# Patient Record
Sex: Female | Born: 1975 | Race: White | Hispanic: No | Marital: Married | Smoking: Current some day smoker
Health system: Southern US, Community
[De-identification: ages and names within clinical notes are randomized; demographics above are authoritative.]

## PROBLEM LIST (undated history)

## (undated) ENCOUNTER — Ambulatory Visit: Payer: Medicaid Other

## (undated) DIAGNOSIS — F419 Anxiety disorder, unspecified: Secondary | ICD-10-CM

## (undated) DIAGNOSIS — I10 Essential (primary) hypertension: Secondary | ICD-10-CM

## (undated) DIAGNOSIS — G43909 Migraine, unspecified, not intractable, without status migrainosus: Secondary | ICD-10-CM

## (undated) DIAGNOSIS — N912 Amenorrhea, unspecified: Secondary | ICD-10-CM

## (undated) HISTORY — DX: Amenorrhea, unspecified: N91.2

## (undated) HISTORY — PX: MENISCUS REPAIR: SHX5179

## (undated) HISTORY — DX: Migraine, unspecified, not intractable, without status migrainosus: G43.909

## (undated) HISTORY — DX: Anxiety disorder, unspecified: F41.9

---

## 2013-02-17 ENCOUNTER — Emergency Department: Payer: Self-pay | Admitting: Emergency Medicine

## 2013-02-17 LAB — URINALYSIS, COMPLETE
Bilirubin,UR: NEGATIVE
Glucose,UR: NEGATIVE mg/dL (ref 0–75)
Ketone: NEGATIVE
Nitrite: NEGATIVE
Ph: 5 (ref 4.5–8.0)
RBC,UR: 6 /HPF (ref 0–5)
Specific Gravity: 1.018 (ref 1.003–1.030)
Squamous Epithelial: 4

## 2013-02-19 LAB — URINE CULTURE

## 2014-12-06 ENCOUNTER — Emergency Department: Admit: 2014-12-06 | Disposition: A | Discharge status: Home / Self Care | Payer: Self-pay | Admitting: Internal Medicine

## 2015-01-02 ENCOUNTER — Emergency Department: Payer: Self-pay

## 2015-01-02 ENCOUNTER — Emergency Department
Admission: EM | Admit: 2015-01-02 | Discharge: 2015-01-02 | Disposition: A | Discharge status: Home / Self Care | Payer: Self-pay | Attending: Emergency Medicine | Admitting: Emergency Medicine

## 2015-01-02 DIAGNOSIS — J40 Bronchitis, not specified as acute or chronic: Secondary | ICD-10-CM

## 2015-01-02 DIAGNOSIS — Z720 Tobacco use: Secondary | ICD-10-CM | POA: Insufficient documentation

## 2015-01-02 DIAGNOSIS — I10 Essential (primary) hypertension: Secondary | ICD-10-CM | POA: Insufficient documentation

## 2015-01-02 DIAGNOSIS — J45901 Unspecified asthma with (acute) exacerbation: Secondary | ICD-10-CM | POA: Insufficient documentation

## 2015-01-02 HISTORY — DX: Essential (primary) hypertension: I10

## 2015-01-02 MED ORDER — PREDNISONE 20 MG PO TABS
60.0000 mg | ORAL_TABLET | Freq: Once | ORAL | Status: AC
  Administered 2015-01-02: 60 mg via ORAL

## 2015-01-02 MED ORDER — PREDNISONE 20 MG PO TABS
ORAL_TABLET | ORAL | Status: AC
  Filled 2015-01-02: qty 3

## 2015-01-02 MED ORDER — AZITHROMYCIN 250 MG PO TABS: ORAL_TABLET | ORAL | Status: DC

## 2015-01-02 MED ORDER — ALBUTEROL SULFATE HFA 108 (90 BASE) MCG/ACT IN AERS: 2.0000 | INHALATION_SPRAY | RESPIRATORY_TRACT | Status: DC | PRN

## 2015-01-02 MED ORDER — IPRATROPIUM-ALBUTEROL 0.5-2.5 (3) MG/3ML IN SOLN
3.0000 mL | Freq: Once | RESPIRATORY_TRACT | Status: AC
  Administered 2015-01-02: 3 mL via RESPIRATORY_TRACT

## 2015-01-02 MED ORDER — IPRATROPIUM-ALBUTEROL 0.5-2.5 (3) MG/3ML IN SOLN
RESPIRATORY_TRACT | Status: AC
  Filled 2015-01-02: qty 3

## 2015-01-02 MED ORDER — PREDNISONE 10 MG PO TABS: ORAL_TABLET | ORAL | Status: DC

## 2015-01-02 NOTE — ED Notes (Signed)
NAD noted at time of D/C. Pt denies questions or concerns. Pt ambulatory to the lobby at this time.  

## 2015-01-02 NOTE — ED Notes (Signed)
Pt c/o cough with congestion for the past 3-4 weeks.  Pt is in NAD at presernt.

## 2015-01-02 NOTE — ED Provider Notes (Signed)
[facility] Emergency Department Provider Note  ____________________________________________  Time seen: Approximately 2:20 PM  I have reviewed the triage vital signs and the nursing notes.   HISTORY  Chief Complaint Cough   HPI Joyce Gonzalez is a 39 y.o. female presents for the complaints of 3-4 weeks with cough and congestion. Patient states cough is worse at night with intermittent wheezes. Patient denies chest pain or shortness of breath. Denies fever. Reports continues to eat and drink well.  Reports history of asthma and states that she frequently has wheezing with sickness. Also reports seasonal allergies.   Past Medical History  Diagnosis Date  . Asthma   . Hypertension     There are no active problems to display for this patient.   Past Surgical History  Procedure Laterality Date  . Meniscus repair Right     Current Outpatient Rx  Name  Route  Sig  Dispense  Refill  . albuterol (PROVENTIL HFA;VENTOLIN HFA) 108 (90 BASE) MCG/ACT inhaler   Inhalation   Inhale 2 puffs into the lungs every 4 (four) hours as needed for wheezing or shortness of breath.         . Multiple Vitamins-Minerals (MULTIVITAMIN WITH MINERALS) tablet   Oral   Take 1 tablet by mouth daily.           Allergies Review of patient's allergies indicates no known allergies.  No family history on file.  Social History History  Substance Use Topics  . Smoking status: Current Every Day Smoker -- 0.50 packs/day    Types: Cigarettes  . Smokeless tobacco: Never Used  . Alcohol Use: No    Review of Systems Constitutional: No fever/chills Eyes: No visual changes. ENT: No sore throat. Positive for runny nose and congestion Cardiovascular: Denies chest pain. Respiratory: Denies shortness of breath. Positive for cough and wheezes Gastrointestinal: No abdominal pain.  No nausea, no vomiting.  No diarrhea.  No constipation. Genitourinary: Negative for  dysuria. Musculoskeletal: Negative for back pain. Skin: Negative for rash. Neurological: Negative for headaches, focal weakness or numbness.  10-point ROS otherwise negative.  ____________________________________________   PHYSICAL EXAM:  VITAL SIGNS: ED Triage Vitals  Enc Vitals Group     BP 01/02/15 1308 122/85 mmHg     Pulse Rate 01/02/15 1308 94     Resp 01/02/15 1308 20     Temp 01/02/15 1308 98.6 F (37 C)     Temp Source 01/02/15 1308 Oral     SpO2 01/02/15 1308 98 %     Weight 01/02/15 1308 172 lb (78.019 kg)     Height 01/02/15 1308  (1.676 m)     Head Cir --      Peak Flow --      Pain Score 01/02/15 1309 6     Pain Loc --      Pain Edu? --      Excl. in GC? --     Constitutional: Alert and oriented. Well appearing and in no acute distress. Eyes: Conjunctivae are normal. PERRL. EOMI. Head: Atraumatic. Nose:mild clear rhinorrhea Mouth/Throat: Mucous membranes are moist.  Oropharynx non-erythematous. Neck: No stridor.  No cervical spine tenderness to palpation. Hematological/Lymphatic/Immunilogical: No cervical lymphadenopathy. Cardiovascular: Normal rate, regular rhythm. Grossly normal heart sounds.  Good peripheral circulation. Respiratory: Normal respiratory effort.  No retractions. Mild inspiratory wheezes left upper and lower lobe. No rhonchi or rales. Gastrointestinal: Soft and nontender. No distention. No abdominal bruits. No CVA tenderness. Musculoskeletal: No lower extremity tenderness  nor edema.  No joint effusions. Neurologic:  Normal speech and language. No gross focal neurologic deficits are appreciated. Speech is normal. No gait instability. Skin:  Skin is warm, dry and intact. No rash noted. Psychiatric: Mood and affect are normal. Speech and behavior are normal.   RADIOLOGY  CHEST 2 VIEW  COMPARISON: None.  FINDINGS: Lungs are clear. Heart size and pulmonary vascularity are normal. No adenopathy. No bone  lesions.  IMPRESSION: No edema or consolidation.   Electronically Signed By: Bretta BangWilliam Woodruff III M.D. On: 01/02/2015 15:07 _______________________________________________________________________________________   INITIAL IMPRESSION / ASSESSMENT AND PLAN / ED COURSE  Pertinent labs & imaging results that were available during my care of the patient were reviewed by me and considered in my medical decision making (see chart for details).  Well-appearing patient. Patient presents to the ER for 3-4 weeks of runny nose, cough, congestion with intermittent wheezes. Patient with wheezing on initial presentation to the ER.   1510: After albuterol nebulizer wheezes resolved and patient reports feeling much better. Chest x-ray negative for edema or consolidation. Patient to be discharged with prednisone taper, z-pack,  as well as albuterol as needed. Discussed follow-up and return parameters. Patient agreed to plan.  ____________________________________________   FINAL CLINICAL IMPRESSION(S) / ED DIAGNOSES  Final diagnoses:  Bronchitis      Renford DillsLindsey Marlei Glomski, NP 01/02/15 1541  Minna AntisKevin Paduchowski, MD 01/04/15 1249

## 2015-01-02 NOTE — Discharge Instructions (Signed)
Take medication as prescribed. Drink plenty of water. Rest. Follow-up with primary care physician or the above .  Return to the ER for new or worsening concerns.

## 2015-08-10 ENCOUNTER — Encounter: Payer: Self-pay | Admitting: Emergency Medicine

## 2015-08-10 ENCOUNTER — Ambulatory Visit
Admission: EM | Admit: 2015-08-10 | Discharge: 2015-08-10 | Disposition: A | Discharge status: Home / Self Care | Payer: Medicaid Other | Attending: Family Medicine | Admitting: Family Medicine

## 2015-08-10 DIAGNOSIS — J4521 Mild intermittent asthma with (acute) exacerbation: Secondary | ICD-10-CM | POA: Diagnosis not present

## 2015-08-10 DIAGNOSIS — R300 Dysuria: Secondary | ICD-10-CM | POA: Insufficient documentation

## 2015-08-10 DIAGNOSIS — R509 Fever, unspecified: Secondary | ICD-10-CM | POA: Insufficient documentation

## 2015-08-10 LAB — URINALYSIS COMPLETE WITH MICROSCOPIC (ARMC ONLY)
Bilirubin Urine: NEGATIVE
Glucose, UA: NEGATIVE mg/dL
Ketones, ur: NEGATIVE mg/dL
Leukocytes, UA: NEGATIVE
Nitrite: NEGATIVE
PROTEIN: NEGATIVE mg/dL
Specific Gravity, Urine: 1.01 (ref 1.005–1.030)
pH: 6 (ref 5.0–8.0)

## 2015-08-10 MED ORDER — AZITHROMYCIN 250 MG PO TABS: ORAL_TABLET | ORAL | Status: DC

## 2015-08-10 NOTE — ED Provider Notes (Signed)
CSN: [id number]     Arrival date & time 08/10/15  1347 History   First MD Initiated Contact with Patient 08/10/15 1512     Chief Complaint  Patient presents with  . Dysuria  . Cough   (Consider location/radiation/quality/duration/timing/severity/associated sxs/prior Treatment) Patient is a 40 y.o. female presenting with URI. The history is provided by the patient.  URI Presenting symptoms: congestion, cough, ear pain, fever and rhinorrhea   Severity:  Moderate Onset quality:  Sudden Duration:  1 week Timing:  Constant Progression:  Worsening Chronicity:  New Relieved by:  Nothing Ineffective treatments:  OTC medications Associated symptoms: myalgias and wheezing   Risk factors: chronic respiratory disease (history of mild intermittent asthma)     Past Medical History  Diagnosis Date  . Asthma   . Hypertension    Past Surgical History  Procedure Laterality Date  . Meniscus repair Right    History reviewed. No pertinent family history. Social History  Substance Use Topics  . Smoking status: Current Every Day Smoker -- 0.50 packs/day    Types: Cigarettes  . Smokeless tobacco: Never Used  . Alcohol Use: No   OB History    Gravida Para Term Preterm AB TAB SAB Ectopic Multiple Living   3         3     Review of Systems  Constitutional: Positive for fever.  HENT: Positive for congestion, ear pain and rhinorrhea.   Respiratory: Positive for cough and wheezing.   Musculoskeletal: Positive for myalgias.    Allergies  Review of patient's allergies indicates no known allergies.  Home Medications   Prior to Admission medications   Medication Sig Start Date End Date Taking? Authorizing Provider  albuterol (PROVENTIL HFA;VENTOLIN HFA) 108 (90 BASE) MCG/ACT inhaler Inhale 2 puffs into the lungs every 4 (four) hours as needed for wheezing or shortness of breath. 01/02/15   Renford DillsLindsey Miller, NP  azithromycin (ZITHROMAX Z-PAK) 250 MG tablet 2 tabs po once day 1, then 1 tab po qd  for next 4 days 08/10/15   Payton Mccallumrlando Braylon Lemmons, MD  Multiple Vitamins-Minerals (MULTIVITAMIN WITH MINERALS) tablet Take 1 tablet by mouth daily.    Historical Provider, MD  predniSONE (DELTASONE) 10 MG tablet Start 60 mg po day one, then 50 mg po day two, taper by 10 mg daily until complete. 01/02/15   Renford DillsLindsey Miller, NP   Meds Ordered and Administered this Visit  Medications - No data to display  BP 137/92 mmHg  Pulse 83  Temp(Src) 97.7 F (36.5 C) (Oral)  Resp 16  Ht 5\' 6"  (1.676 m)  Wt 175 lb (79.379 kg)  BMI 28.26 kg/m2  SpO2 99%  LMP 07/27/2015 (Approximate) No data found.   Physical Exam  Constitutional: She appears well-developed and well-nourished. No distress.  HENT:  Head: Normocephalic and atraumatic.  Right Ear: Tympanic membrane, external ear and ear canal normal.  Left Ear: Tympanic membrane, external ear and ear canal normal.  Nose: Rhinorrhea present. No nose lacerations, sinus tenderness, nasal deformity, septal deviation or nasal septal hematoma. No epistaxis.  No foreign bodies.  Mouth/Throat: Uvula is midline and mucous membranes are normal. Posterior oropharyngeal erythema present. No oropharyngeal exudate, posterior oropharyngeal edema or tonsillar abscesses.  Eyes: Conjunctivae and EOM are normal. Pupils are equal, round, and reactive to light. Right eye exhibits no discharge. Left eye exhibits no discharge. No scleral icterus.  Neck: Normal range of motion. Neck supple. No thyromegaly present.  Cardiovascular: Normal rate, regular rhythm and normal heart sounds.  Pulmonary/Chest: Effort normal. No respiratory distress. She has wheezes (diffuse, mild wheezes bilaterally). She has no rales.  Diffuse rhonchi   Lymphadenopathy:    She has no cervical adenopathy.  Skin: She is not diaphoretic.  Nursing note and vitals reviewed.   ED Course  Procedures (including critical care time)  Labs Review Labs Reviewed  URINALYSIS COMPLETEWITH MICROSCOPIC ([facility] ONLY) -  Abnormal; Notable for the following:    Hgb urine dipstick TRACE (*)    Bacteria, UA RARE (*)    Squamous Epithelial / LPF 6-30 (*)    All other components within normal limits  URINE CULTURE    Imaging Review No results found.   Visual Acuity Review  Right Eye Distance:   Left Eye Distance:   Bilateral Distance:    Right Eye Near:   Left Eye Near:    Bilateral Near:         MDM   1. Asthmatic bronchitis, mild intermittent, with acute exacerbation    Discharge Medication List as of 08/10/2015  3:21 PM     Discharge Medication List as of 08/10/2015  3:21 PM    1.diagnosis reviewed with patient 2. rx as per orders above; reviewed possible side effects, interactions, risks and benefits; given rx for zithromax as per orders 3. Recommend supportive treatment with rest, increased fluids; continue home albuterol inhaler prn  4. Follow-up prn if symptoms worsen or don't improve   Payton Mccallum, MD 08/10/15 1527

## 2015-08-10 NOTE — ED Notes (Signed)
Patient c/o cough and chest congestion for a week.   Patient c/o burning when urinating for a week.

## 2015-08-12 ENCOUNTER — Telehealth: Payer: Self-pay | Admitting: *Deleted

## 2015-08-12 LAB — URINE CULTURE
Culture: NO GROWTH
SPECIAL REQUESTS: NORMAL

## 2015-08-12 NOTE — ED Notes (Signed)
Unable to leave message. Home number not in service and work number does not have a Technical brewermailbox.

## 2015-08-25 ENCOUNTER — Ambulatory Visit
Admission: EM | Admit: 2015-08-25 | Discharge: 2015-08-25 | Disposition: A | Discharge status: Home / Self Care | Payer: Medicaid Other | Attending: Family Medicine | Admitting: Family Medicine

## 2015-08-25 DIAGNOSIS — S139XXA Sprain of joints and ligaments of unspecified parts of neck, initial encounter: Secondary | ICD-10-CM | POA: Diagnosis not present

## 2015-08-25 MED ORDER — CYCLOBENZAPRINE HCL 10 MG PO TABS: 10.0000 mg | ORAL_TABLET | Freq: Every day | ORAL | Status: DC

## 2015-08-25 MED ORDER — HYDROCODONE-ACETAMINOPHEN 5-325 MG PO TABS: 1.0000 | ORAL_TABLET | Freq: Four times a day (QID) | ORAL | Status: DC | PRN

## 2015-08-25 NOTE — ED Notes (Signed)
Pt states she turned her head quickly about 4 days ago and is having pain to the right side since

## 2015-08-25 NOTE — ED Provider Notes (Signed)
CSN: [id number]     Arrival date & time 08/25/15  2130 History   First MD Initiated Contact with Patient 08/25/15 1036     Chief Complaint  Patient presents with  . Neck Pain   (Consider location/radiation/quality/duration/timing/severity/associated sxs/prior Treatment) Patient is a 41 y.o. female presenting with neck pain. The history is provided by the patient.  Neck Pain Pain location:  R side Quality:  Aching Pain radiates to:  Does not radiate Pain severity:  Moderate Pain is:  Same all the time Onset quality:  Sudden Duration:  4 days Timing:  Constant Progression:  Unchanged Chronicity:  New Context: not recent injury   Context comment:  Twisting her head   Past Medical History  Diagnosis Date  . Asthma   . Hypertension    Past Surgical History  Procedure Laterality Date  . Meniscus repair Right    No family history on file. Social History  Substance Use Topics  . Smoking status: Current Every Day Smoker -- 0.50 packs/day    Types: Cigarettes  . Smokeless tobacco: Never Used  . Alcohol Use: No   OB History    Gravida Para Term Preterm AB TAB SAB Ectopic Multiple Living   3         3     Review of Systems  Musculoskeletal: Positive for neck pain.    Allergies  Review of patient's allergies indicates no known allergies.  Home Medications   Prior to Admission medications   Medication Sig Start Date End Date Taking? Authorizing Provider  albuterol (PROVENTIL HFA;VENTOLIN HFA) 108 (90 BASE) MCG/ACT inhaler Inhale 2 puffs into the lungs every 4 (four) hours as needed for wheezing or shortness of breath. 01/02/15   Renford Dills, NP  azithromycin (ZITHROMAX Z-PAK) 250 MG tablet 2 tabs po once day 1, then 1 tab po qd for next 4 days 08/10/15   Payton Mccallum, MD  cyclobenzaprine (FLEXERIL) 10 MG tablet Take 1 tablet (10 mg total) by mouth at bedtime. 08/25/15   Payton Mccallum, MD  HYDROcodone-acetaminophen (NORCO/VICODIN) 5-325 MG tablet Take 1-2 tablets by mouth  every 6 (six) hours as needed. 08/25/15   Payton Mccallum, MD  Multiple Vitamins-Minerals (MULTIVITAMIN WITH MINERALS) tablet Take 1 tablet by mouth daily.    Historical Provider, MD  predniSONE (DELTASONE) 10 MG tablet Start 60 mg po day one, then 50 mg po day two, taper by 10 mg daily until complete. 01/02/15   Renford Dills, NP   Meds Ordered and Administered this Visit  Medications - No data to display  BP 120/77 mmHg  Pulse 87  Temp(Src) 98.1 F (36.7 C) (Oral)  Resp 16  Ht  (1.575 m)  Wt 170 lb (77.111 kg)  BMI 31.09 kg/m2  SpO2 98%  LMP 07/27/2015 (Approximate) No data found.   Physical Exam  Constitutional: She is oriented to person, place, and time. She appears well-developed and well-nourished. No distress.  HENT:  Head: Normocephalic and atraumatic.  Eyes: EOM are normal. Pupils are equal, round, and reactive to light.  Neck: Normal range of motion. Neck supple. No tracheal deviation present. No thyromegaly present.  Musculoskeletal: She exhibits no edema.       Cervical back: She exhibits tenderness (over the right trapezius muscle) and spasm. She exhibits normal range of motion, no bony tenderness, no swelling, no edema, no deformity, no laceration and normal pulse.  Lymphadenopathy:    She has no cervical adenopathy.  Neurological: She is alert and oriented to person,  place, and time. She has normal reflexes. No cranial nerve deficit. She exhibits normal muscle tone. Coordination normal.  Skin: She is not diaphoretic.  Nursing note and vitals reviewed.   ED Course  Procedures (including critical care time)  Labs Review Labs Reviewed - No data to display  Imaging Review No results found.   Visual Acuity Review  Right Eye Distance:   Left Eye Distance:   Bilateral Distance:    Right Eye Near:   Left Eye Near:    Bilateral Near:         MDM   1. Neck sprain, initial encounter    Discharge Medication List as of 08/25/2015 12:24 PM    START  taking these medications   Details  cyclobenzaprine (FLEXERIL) 10 MG tablet Take 1 tablet (10 mg total) by mouth at bedtime., Starting 08/25/2015, Until Discontinued, Normal    HYDROcodone-acetaminophen (NORCO/VICODIN) 5-325 MG tablet Take 1-2 tablets by mouth every 6 (six) hours as needed., Starting 08/25/2015, Until Discontinued, Print       1. diagnosis reviewed with patient  2. rx as per orders above; reviewed possible side effects, interactions, risks and benefits  3. Recommend supportive treatment with heat to affected area, gentle stretches and range of motion exercises 4. Follow-up prn if symptoms worsen or don't improve    Payton Mccallum, MD 08/25/15 1815

## 2015-10-22 ENCOUNTER — Encounter: Payer: Self-pay | Admitting: *Deleted

## 2015-10-22 ENCOUNTER — Ambulatory Visit
Admission: EM | Admit: 2015-10-22 | Discharge: 2015-10-22 | Disposition: A | Discharge status: Home / Self Care | Payer: Medicaid Other | Attending: Family Medicine | Admitting: Family Medicine

## 2015-10-22 ENCOUNTER — Ambulatory Visit: Payer: Medicaid Other

## 2015-10-22 DIAGNOSIS — R091 Pleurisy: Secondary | ICD-10-CM | POA: Diagnosis present

## 2015-10-22 DIAGNOSIS — J40 Bronchitis, not specified as acute or chronic: Secondary | ICD-10-CM | POA: Diagnosis not present

## 2015-10-22 DIAGNOSIS — R05 Cough: Secondary | ICD-10-CM | POA: Diagnosis present

## 2015-10-22 DIAGNOSIS — J209 Acute bronchitis, unspecified: Secondary | ICD-10-CM | POA: Insufficient documentation

## 2015-10-22 DIAGNOSIS — Z87891 Personal history of nicotine dependence: Secondary | ICD-10-CM | POA: Insufficient documentation

## 2015-10-22 DIAGNOSIS — J029 Acute pharyngitis, unspecified: Secondary | ICD-10-CM | POA: Diagnosis present

## 2015-10-22 DIAGNOSIS — I10 Essential (primary) hypertension: Secondary | ICD-10-CM | POA: Diagnosis not present

## 2015-10-22 LAB — RAPID INFLUENZA A&B ANTIGENS
Influenza A (ARMC): NEGATIVE
Influenza B (ARMC): NEGATIVE

## 2015-10-22 MED ORDER — HYDROCOD POLST-CPM POLST ER 10-8 MG/5ML PO SUER: 5.0000 mL | Freq: Two times a day (BID) | ORAL | Status: DC

## 2015-10-22 MED ORDER — ALBUTEROL SULFATE HFA 108 (90 BASE) MCG/ACT IN AERS: 1.0000 | INHALATION_SPRAY | Freq: Four times a day (QID) | RESPIRATORY_TRACT | Status: DC | PRN

## 2015-10-22 MED ORDER — AZITHROMYCIN 250 MG PO TABS: ORAL_TABLET | ORAL | Status: DC

## 2015-10-22 MED ORDER — BENZONATATE 200 MG PO CAPS: 200.0000 mg | ORAL_CAPSULE | Freq: Three times a day (TID) | ORAL | Status: DC

## 2015-10-22 NOTE — ED Provider Notes (Signed)
CSN: [id number]     Arrival date & time 10/22/15  1221 History   First MD Initiated Contact with Patient 10/22/15 1332     Chief Complaint  Patient presents with  . Pleurisy  . Fever  . Sore Throat  . Cough   (Consider location/radiation/quality/duration/timing/severity/associated sxs/prior Treatment) HPI   This a 40 year old female who presents with a productive cough of yellow to green sputum chest soreness with the coughing didn't fever up to 100 along with sinus symptoms. This been going on for 3 weeks. It seems to be worse today with shortness of breath and increased coughing particularly at nighttime. Her family has had recent upper URI symptoms. Patient has frequent bouts of asthmatic bronchitis and just quit smoking about 4 months ago. Her husband continues to smoke a pack a day. Today she is afebrile at 98 pulse is 120 respirations 16 blood pressure 120/86 and her O2 sats are 98% on room air.  Past Medical History  Diagnosis Date  . Asthma   . Hypertension    Past Surgical History  Procedure Laterality Date  . Meniscus repair Right    History reviewed. No pertinent family history. Social History  Substance Use Topics  . Smoking status: Former Smoker -- 0.50 packs/day    Types: Cigarettes  . Smokeless tobacco: Never Used  . Alcohol Use: Yes   OB History    Gravida Para Term Preterm AB TAB SAB Ectopic Multiple Living   3         3     Review of Systems  Constitutional: Positive for fever, chills, activity change and fatigue.  HENT: Positive for congestion, postnasal drip, rhinorrhea and sinus pressure.   Respiratory: Positive for cough and shortness of breath. Negative for wheezing and stridor.   All other systems reviewed and are negative.   Allergies  Review of patient's allergies indicates no known allergies.  Home Medications   Prior to Admission medications   Medication Sig Start Date End Date Taking? Authorizing Provider  fluticasone (FLONASE) 50  MCG/ACT nasal spray Place 2 sprays into both nostrils daily.   Yes Historical Provider, MD  Multiple Vitamins-Minerals (MULTIVITAMIN WITH MINERALS) tablet Take 1 tablet by mouth daily.   Yes Historical Provider, MD  albuterol (PROVENTIL HFA;VENTOLIN HFA) 108 (90 Base) MCG/ACT inhaler Inhale 1-2 puffs into the lungs every 6 (six) hours as needed for wheezing or shortness of breath. 10/22/15   Lutricia Feil, PA-C  azithromycin (ZITHROMAX Z-PAK) 250 MG tablet Use as per package instructions 10/22/15   Lutricia Feil, PA-C  benzonatate (TESSALON) 200 MG capsule Take 1 capsule (200 mg total) by mouth every 8 (eight) hours. 10/22/15   Lutricia Feil, PA-C  chlorpheniramine-HYDROcodone (TUSSIONEX PENNKINETIC ER) 10-8 MG/5ML SUER Take 5 mLs by mouth 2 (two) times daily. 10/22/15   Lutricia Feil, PA-C  cyclobenzaprine (FLEXERIL) 10 MG tablet Take 1 tablet (10 mg total) by mouth at bedtime. 08/25/15   Payton Mccallum, MD  HYDROcodone-acetaminophen (NORCO/VICODIN) 5-325 MG tablet Take 1-2 tablets by mouth every 6 (six) hours as needed. 08/25/15   Payton Mccallum, MD  predniSONE (DELTASONE) 10 MG tablet Start 60 mg po day one, then 50 mg po day two, taper by 10 mg daily until complete. 01/02/15   Renford Dills, NP   Meds Ordered and Administered this Visit  Medications - No data to display  BP 128/86 mmHg  Pulse 120  Temp(Src) 98 F (36.7 C) (Oral)  Resp 16  Ht  (  1.676 m)  Wt 185 lb (83.915 kg)  BMI 29.87 kg/m2  SpO2 98%  LMP 10/08/2015 (Exact Date) No data found.   Physical Exam  Constitutional: She is oriented to person, place, and time. She appears well-developed and well-nourished. No distress.  HENT:  Head: Normocephalic and atraumatic.  Right Ear: External ear normal.  Left Ear: External ear normal.  Nose: Nose normal.  Mouth/Throat: Oropharynx is clear and moist. No oropharyngeal exudate.  Eyes: Conjunctivae are normal. Pupils are equal, round, and reactive to light.  Neck: Normal  range of motion.  Pulmonary/Chest: Effort normal. No respiratory distress. She has no wheezes. She has rales.  Fine bibasilar rales are appreciated  Musculoskeletal: Normal range of motion. She exhibits no edema or tenderness.  Lymphadenopathy:    She has no cervical adenopathy.  Neurological: She is alert and oriented to person, place, and time.  Skin: Skin is warm and dry. She is not diaphoretic.  Psychiatric: She has a normal mood and affect. Her behavior is normal. Judgment and thought content normal.  Nursing note and vitals reviewed.   ED Course  Procedures (including critical care time)  Labs Review Labs Reviewed  RAPID INFLUENZA A&B ANTIGENS [facility] ONLY)    Imaging Review Dg Chest 2 View  10/22/2015  CLINICAL DATA:  Dry cough for 3 weeks with fever EXAM: CHEST  2 VIEW COMPARISON:  01/02/2015 FINDINGS: The heart size and mediastinal contours are within normal limits. Both lungs are clear. The visualized skeletal structures are unremarkable. IMPRESSION: No active cardiopulmonary disease. Electronically Signed   By: Alcide CleverMark  Lukens M.D.   On: 10/22/2015 14:11     Visual Acuity Review  Right Eye Distance:   Left Eye Distance:   Bilateral Distance:    Right Eye Near:   Left Eye Near:    Bilateral Near:         MDM   1. Bronchitis    New Prescriptions   ALBUTEROL (PROVENTIL HFA;VENTOLIN HFA) 108 (90 BASE) MCG/ACT INHALER    Inhale 1-2 puffs into the lungs every 6 (six) hours as needed for wheezing or shortness of breath.   AZITHROMYCIN (ZITHROMAX Z-PAK) 250 MG TABLET    Use as per package instructions   BENZONATATE (TESSALON) 200 MG CAPSULE    Take 1 capsule (200 mg total) by mouth every 8 (eight) hours.   CHLORPHENIRAMINE-HYDROCODONE (TUSSIONEX PENNKINETIC ER) 10-8 MG/5ML SUER    Take 5 mLs by mouth 2 (two) times daily.  Plan: 1. Test/x-ray results and diagnosis reviewed with patient 2. rx as per orders; risks, benefits, potential side effects reviewed with  patient 3. Recommend supportive treatment with Fluids and rest. Should follow-up with her primary care physician if she is not improving 4. F/u prn if symptoms worsen or don't improve     Lutricia FeilWilliam P Ricardo Schubach, PA-C 10/22/15 1437

## 2015-10-22 NOTE — ED Notes (Signed)
Productive cough- yellow, chest soreness/ with coughing/resp/movement, intermittent fever, runny nose, x 3 weeks. Worse today. Left side neck tenderness. Family has had recent URI symptoms.

## 2015-10-22 NOTE — Discharge Instructions (Signed)
How to Use an Inhaler °Proper inhaler technique is very important. Good technique ensures that the medicine reaches the lungs. Poor technique results in depositing the medicine on the tongue and back of the throat rather than in the airways. If you do not use the inhaler with good technique, the medicine will not help you. °STEPS TO FOLLOW IF USING AN INHALER WITHOUT AN EXTENSION TUBE °1. Remove the cap from the inhaler. °2. If you are using the inhaler for the first time, you will need to prime it. Shake the inhaler for 5 seconds and release four puffs into the air, away from your face. Ask your health care provider or pharmacist if you have questions about priming your inhaler. °3. Shake the inhaler for 5 seconds before each breath in (inhalation). °4. Position the inhaler so that the top of the canister faces up. °5. Put your index finger on the top of the medicine canister. Your thumb supports the bottom of the inhaler. °6. Open your mouth. °7. Either place the inhaler between your teeth and place your lips tightly around the mouthpiece, or hold the inhaler 1-2 inches away from your open mouth. If you are unsure of which technique to use, ask your health care provider. °8. Breathe out (exhale) normally and as completely as possible. °9. Press the canister down with your index finger to release the medicine. °10. At the same time as the canister is pressed, inhale deeply and slowly until your lungs are completely filled. This should take 4-6 seconds. Keep your tongue down. °11. Hold the medicine in your lungs for 5-10 seconds (10 seconds is best). This helps the medicine get into the small airways of your lungs. °12. Breathe out slowly, through pursed lips. Whistling is an example of pursed lips. °13. Wait at least 15-30 seconds between puffs. Continue with the above steps until you have taken the number of puffs your health care provider has ordered. Do not use the inhaler more than your health care provider  tells you. °14. Replace the cap on the inhaler. °15. Follow the directions from your health care provider or the inhaler insert for cleaning the inhaler. °STEPS TO FOLLOW IF USING AN INHALER WITH AN EXTENSION (SPACER) °1. Remove the cap from the inhaler. °2. If you are using the inhaler for the first time, you will need to prime it. Shake the inhaler for 5 seconds and release four puffs into the air, away from your face. Ask your health care provider or pharmacist if you have questions about priming your inhaler. °3. Shake the inhaler for 5 seconds before each breath in (inhalation). °4. Place the open end of the spacer onto the mouthpiece of the inhaler. °5. Position the inhaler so that the top of the canister faces up and the spacer mouthpiece faces you. °6. Put your index finger on the top of the medicine canister. Your thumb supports the bottom of the inhaler and the spacer. °7. Breathe out (exhale) normally and as completely as possible. °8. Immediately after exhaling, place the spacer between your teeth and into your mouth. Close your lips tightly around the spacer. °9. Press the canister down with your index finger to release the medicine. °10. At the same time as the canister is pressed, inhale deeply and slowly until your lungs are completely filled. This should take 4-6 seconds. Keep your tongue down and out of the way. °11. Hold the medicine in your lungs for 5-10 seconds (10 seconds is best). This helps the   medicine get into the small airways of your lungs. Exhale. °12. Repeat inhaling deeply through the spacer mouthpiece. Again hold that breath for up to 10 seconds (10 seconds is best). Exhale slowly. If it is difficult to take this second deep breath through the spacer, breathe normally several times through the spacer. Remove the spacer from your mouth. °13. Wait at least 15-30 seconds between puffs. Continue with the above steps until you have taken the number of puffs your health care provider has  ordered. Do not use the inhaler more than your health care provider tells you. °14. Remove the spacer from the inhaler, and place the cap on the inhaler. °15. Follow the directions from your health care provider or the inhaler insert for cleaning the inhaler and spacer. °If you are using different kinds of inhalers, use your quick relief medicine to open the airways 10-15 minutes before using a steroid if instructed to do so by your health care provider. If you are unsure which inhalers to use and the order of using them, ask your health care provider, nurse, or respiratory therapist. °If you are using a steroid inhaler, always rinse your mouth with water after your last puff, then gargle and spit out the water. Do not swallow the water. °AVOID: °· Inhaling before or after starting the spray of medicine. It takes practice to coordinate your breathing with triggering the spray. °· Inhaling through the nose (rather than the mouth) when triggering the spray. °HOW TO DETERMINE IF YOUR INHALER IS FULL OR NEARLY EMPTY °You cannot know when an inhaler is empty by shaking it. A few inhalers are now being made with dose counters. Ask your health care provider for a prescription that has a dose counter if you feel you need that extra help. If your inhaler does not have a counter, ask your health care provider to help you determine the date you need to refill your inhaler. Write the refill date on a calendar or your inhaler canister. Refill your inhaler 7-10 days before it runs out. Be sure to keep an adequate supply of medicine. This includes making sure it is not expired, and that you have a spare inhaler.  °SEEK MEDICAL CARE IF:  °· Your symptoms are only partially relieved with your inhaler. °· You are having trouble using your inhaler. °· You have some increase in phlegm. °SEEK IMMEDIATE MEDICAL CARE IF:  °· You feel little or no relief with your inhalers. You are still wheezing and are feeling shortness of breath or  tightness in your chest or both. °· You have dizziness, headaches, or a fast heart rate. °· You have chills, fever, or night sweats. °· You have a noticeable increase in phlegm production, or there is blood in the phlegm. °MAKE SURE YOU:  °· Understand these instructions. °· Will watch your condition. °· Will get help right away if you are not doing well or get worse. °  °This information is not intended to replace advice given to you by your health care provider. Make sure you discuss any questions you have with your health care provider. °  °Document Released: 07/22/2000 Document Revised: 05/15/2013 Document Reviewed: 02/21/2013 °Elsevier Interactive Patient Education ©2016 [employer] ° °

## 2016-03-29 ENCOUNTER — Ambulatory Visit
Admission: EM | Admit: 2016-03-29 | Discharge: 2016-03-29 | Disposition: A | Discharge status: Home / Self Care | Payer: Medicaid Other | Attending: Family Medicine | Admitting: Family Medicine

## 2016-03-29 DIAGNOSIS — L0100 Impetigo, unspecified: Secondary | ICD-10-CM

## 2016-03-29 DIAGNOSIS — L259 Unspecified contact dermatitis, unspecified cause: Secondary | ICD-10-CM

## 2016-03-29 MED ORDER — MUPIROCIN 2 % EX OINT: 1.0000 "application " | TOPICAL_OINTMENT | Freq: Three times a day (TID) | CUTANEOUS | 0 refills | Status: DC

## 2016-03-29 NOTE — ED Triage Notes (Addendum)
Patient complains of a rash that has been on her chest for over a month. Its itchy and it bubbles a little at times. It started out as a tiny bite and has progressed. She has been putting neosporin on it. She also complains of a headache which may be from elevated BP.

## 2016-03-29 NOTE — ED Provider Notes (Signed)
CSN: [id number]     Arrival date & time 03/29/16  1546 History   First MD Initiated Contact with Patient 03/29/16 [phone]     Chief Complaint  Patient presents with  . Rash    Chest   (Consider location/radiation/quality/duration/timing/severity/associated sxs/prior Treatment) HPI  This a 40 year old female who presents with a rash on her anterior chest just over the xiphoid process. He states that it started out by a month ago as a small insect bite that has progressively  enlarged. She states that she has been scratching the area because it is been so itchy. He has been using Neosporin twice a day but despite this seems to be worsening. She denies any fever or chills. She does have an elevated blood pressure today 142/100 stating that she has been treated in the past for hypertension but it discontinued Her medications because her blood pressure had normalized. Her pulses also accelerated today 112 but she denies any chest pain or shortness of breath.    Past Medical History:  Diagnosis Date  . Asthma   . Hypertension    Past Surgical History:  Procedure Laterality Date  . MENISCUS REPAIR Right    History reviewed. No pertinent family history. Social History  Substance Use Topics  . Smoking status: Former Smoker    Packs/day: 0.50    Types: Cigarettes  . Smokeless tobacco: Never Used  . Alcohol use Yes   OB History    Gravida Para Term Preterm AB Living   3         3   SAB TAB Ectopic Multiple Live Births                 Review of Systems  Constitutional: Negative for activity change, appetite change, chills, fatigue and fever.  Skin: Positive for color change and rash.  All other systems reviewed and are negative.   Allergies  Review of patient's allergies indicates no known allergies.  Home Medications   Prior to Admission medications   Medication Sig Start Date End Date Taking? Authorizing Provider  ibuprofen (ADVIL,MOTRIN) 800 MG tablet Take 800 mg by mouth as  needed.   Yes Historical Provider, MD  albuterol (PROVENTIL HFA;VENTOLIN HFA) 108 (90 Base) MCG/ACT inhaler Inhale 1-2 puffs into the lungs every 6 (six) hours as needed for wheezing or shortness of breath. 10/22/15   Lutricia FeilWilliam P Arshia Spellman, PA-C  azithromycin (ZITHROMAX Z-PAK) 250 MG tablet Use as per package instructions 10/22/15   Lutricia FeilWilliam P Elis Rawlinson, PA-C  benzonatate (TESSALON) 200 MG capsule Take 1 capsule (200 mg total) by mouth every 8 (eight) hours. 10/22/15   Lutricia FeilWilliam P Imraan Wendell, PA-C  chlorpheniramine-HYDROcodone (TUSSIONEX PENNKINETIC ER) 10-8 MG/5ML SUER Take 5 mLs by mouth 2 (two) times daily. 10/22/15   Lutricia FeilWilliam P Mischa Pollard, PA-C  cyclobenzaprine (FLEXERIL) 10 MG tablet Take 1 tablet (10 mg total) by mouth at bedtime. 08/25/15   Payton Mccallumrlando Conty, MD  fluticasone (FLONASE) 50 MCG/ACT nasal spray Place 2 sprays into both nostrils daily.    Historical Provider, MD  HYDROcodone-acetaminophen (NORCO/VICODIN) 5-325 MG tablet Take 1-2 tablets by mouth every 6 (six) hours as needed. 08/25/15   Payton Mccallumrlando Conty, MD  Multiple Vitamins-Minerals (MULTIVITAMIN WITH MINERALS) tablet Take 1 tablet by mouth daily.    Historical Provider, MD  mupirocin ointment (BACTROBAN) 2 % Apply 1 application topically 3 (three) times daily. 03/29/16   Lutricia FeilWilliam P Priyah Schmuck, PA-C  predniSONE (DELTASONE) 10 MG tablet Start 60 mg po day one, then 50 mg po day  two, taper by 10 mg daily until complete. 01/02/15   Renford DillsLindsey Miller, NP   Meds Ordered and Administered this Visit  Medications - No data to display  BP (!) 142/100 (BP Location: Left Arm)   Pulse (!) 112   Temp 97.5 F (36.4 C) (Tympanic)   Resp 18   Ht 5\' 6"  (1.676 m)   Wt 165 lb (74.8 kg)   LMP 03/15/2016   SpO2 98%   BMI 26.63 kg/m  No data found.   Physical Exam  Constitutional: She is oriented to person, place, and time. She appears well-developed and well-nourished. No distress.  HENT:  Head: Normocephalic and atraumatic.  Eyes: EOM are normal. Pupils are equal, round, and  reactive to light.  Neck: Normal range of motion. Neck supple.  Musculoskeletal: Normal range of motion.  Neurological: She is alert and oriented to person, place, and time.  Skin: Skin is warm and dry. Rash noted. She is not diaphoretic.  Examination of the patient's anterior chest shows a erythematous area that is blanchable excoriations measuring approximately 4-4.5 cm in diameter. It has a honey-colored crusting overlying the area. There is no fluctuance no induration present. There is no drainage other than a honey-colored discharge.  Psychiatric: She has a normal mood and affect. Her behavior is normal. Judgment and thought content normal.  Nursing note and vitals reviewed.   Urgent Care Course   Clinical Course    Procedures (including critical care time)  Labs Review Labs Reviewed - No data to display  Imaging Review No results found.   Visual Acuity Review  Right Eye Distance:   Left Eye Distance:   Bilateral Distance:    Right Eye Near:   Left Eye Near:    Bilateral Near:         MDM   1. Contact dermatitis   2. Impetigo    Discharge Medication List as of 03/29/2016  4:27 PM    START taking these medications   Details  mupirocin ointment (BACTROBAN) 2 % Apply 1 application topically 3 (three) times daily., Starting Tue 03/29/2016, Normal      Plan: 1. Test/x-ray results and diagnosis reviewed with patient 2. rx as per orders; risks, benefits, potential side effects reviewed with patient 3. Recommend supportive treatment with Discontinuation of the Neosporin ointment which is likely causing a contact dermatitis. I will give her Bactroban for the impetigo. She should wash this 3 times daily dry thoroughly and apply the Bactroban. If she is not improving in a week she should follow-up with a dermatologist. Name and phone number were provided to the patient. 4. F/u prn if symptoms worsen or don't improve     Lutricia FeilWilliam P [facility], PA-C 03/29/16 1707

## 2016-04-30 ENCOUNTER — Encounter: Payer: Self-pay | Admitting: *Deleted

## 2016-04-30 ENCOUNTER — Ambulatory Visit
Admission: EM | Admit: 2016-04-30 | Discharge: 2016-04-30 | Disposition: A | Discharge status: Home / Self Care | Payer: Medicaid Other | Attending: Family Medicine | Admitting: Family Medicine

## 2016-04-30 DIAGNOSIS — S61201A Unspecified open wound of left index finger without damage to nail, initial encounter: Secondary | ICD-10-CM | POA: Diagnosis not present

## 2016-04-30 MED ORDER — SULFAMETHOXAZOLE-TRIMETHOPRIM 800-160 MG PO TABS: 1.0000 | ORAL_TABLET | Freq: Two times a day (BID) | ORAL | 0 refills | Status: DC

## 2016-04-30 MED ORDER — MUPIROCIN 2 % EX OINT: TOPICAL_OINTMENT | CUTANEOUS | 0 refills | Status: DC

## 2016-04-30 NOTE — ED Provider Notes (Signed)
[facility] ____________________________________________  Time seen: Approximately 12:21 PM  I have reviewed the triage vital signs and the nursing notes.   HISTORY  Chief Complaint Finger Injury   HPI Joyce Gonzalez is a 40 y.o. female presents for evaluation of left hand second finger wound. Patient reports that approximate 2 weeks ago she noticed 3 small areas to the top of her right second finger that looked like an ant bite. 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 States she thinks she was bitten by something when outside in shed. Patient reports she then tried to squeeze the area to see if it would drain and didn't, and reports that gradually over last 2 weeks a wound has appeared. Patient reports that she does frequently have to wear gloves at work causing her hands to stay moist. Patient does report that she changes jobs this coming week or her hands appear to stay drier. Patient reports that she has been applying multiple over-the-counter topical creams including a topical antibiotic ointment as well as tea tree oil. Patient reports also cleaning the area frequently with alcohol. Patient does report that she has been picking at the area as the area sometimes has pet hair stuck to it.  Patient states that the area is minimally tender. Denies any trauma, crushing injury or decreased range of motion. Denies any numbness or tingling sensation.  Patient denies any recent tick bites or tick attachments. Patient reports a few months ago she did have a tick bite to her right hip and states that she was seen and evaluated for that previously. Patient also does reports she occasionally has a rash to her chest and states that she believes it is from frequently touching the area when she is hot at work. Patient reports that she is here today only for her left finger. Denies any changes in foods, medicines, detergents or other  contacts. Denies chance of pregnancy. Patient reports tetanus immunization 4 years ago.   [facility]: PCP  Past Medical History:  Diagnosis Date  . Asthma   . Hypertension     There are no active problems to display for this patient.   Past Surgical History:  Procedure Laterality Date  . MENISCUS REPAIR Right     Current Outpatient Rx  . Order #: [id number] Class: Normal  . Order #: [id number] Class: Historical Med  . Order #: [id number] Class: Historical Med  . Order #: [id number] Class: Historical Med  . Order #: [id number] Class: Normal  . Order #: [id number] Class: Normal    No current facility-administered medications for this encounter.   Current Outpatient Prescriptions:  .  albuterol (PROVENTIL HFA;VENTOLIN HFA) 108 (90 Base) MCG/ACT inhaler, Inhale 1-2 puffs into the lungs every 6 (six) hours as needed for wheezing or shortness of breath., Disp: 1 Inhaler, Rfl: 0 .  fluticasone (FLONASE) 50 MCG/ACT nasal spray, Place 2 sprays into both nostrils daily., Disp: , Rfl:  .  ibuprofen (ADVIL,MOTRIN) 800 MG tablet, Take 800 mg by mouth as needed., Disp: , Rfl:  .  Multiple Vitamins-Minerals (MULTIVITAMIN WITH MINERALS) tablet, Take 1 tablet by mouth daily., Disp: , Rfl:  .  mupirocin ointment (BACTROBAN) 2 %, Apply three times a day for 5 days., Disp: 22 g, Rfl: 0 .  sulfamethoxazole-trimethoprim (BACTRIM DS,SEPTRA DS) 800-160 MG tablet, Take 1 tablet by mouth 2 (two) times daily., Disp: 14 tablet, Rfl: 0  Allergies Review of patient's allergies indicates no known allergies.  History reviewed. No pertinent family history.  Social History Social History  Substance Use Topics  . Smoking status: Former Smoker    Packs/day: 0.50    Types: Cigarettes  . Smokeless tobacco: Never Used  . Alcohol use Yes    Review of Systems Constitutional: No fever/chills Eyes: No visual changes. ENT: No sore throat. Cardiovascular: Denies chest pain. Respiratory: Denies  shortness of breath. Gastrointestinal: No abdominal pain.  No nausea, no vomiting.  No diarrhea.  No constipation. Genitourinary: Negative for dysuria. Musculoskeletal: Negative for back pain. Skin: As above. Neurological: Negative for headaches, focal weakness or numbness.  10-point ROS otherwise negative.  ____________________________________________   PHYSICAL EXAM:  VITAL SIGNS: ED Triage Vitals  Enc Vitals Group     BP 04/30/16 1127 (!) 127/95     Pulse Rate 04/30/16 1127 92     Resp 04/30/16 1127 16     Temp 04/30/16 1127 97.8 F (36.6 C)     Temp Source 04/30/16 1127 Oral     SpO2 04/30/16 1127 98 %     Weight 04/30/16 1129 165 lb (74.8 kg)     Height 04/30/16 1129 5\' 6"  (1.676 m)     Head Circumference --      Peak Flow --      Pain Score --      Pain Loc --      Pain Edu? --      Excl. in GC? --     Constitutional: Alert and oriented. Well appearing and in no acute distress. Eyes: Conjunctivae are normal. PERRL. EOMI. ENT      Head: Normocephalic and atraumatic.      Mouth/Throat: Mucous membranes are moist. Cardiovascular: Normal rate, regular rhythm. Grossly normal heart sounds.  Good peripheral circulation. Respiratory: Normal respiratory effort without tachypnea nor retractions. Breath sounds are clear and equal bilaterally. No wheezes/rales/rhonchi.. Gastrointestinal: Soft and nontender. Musculoskeletal:  Ambulatory with steady gait. Left hand nontender to palpation, no bony tenderness.  Neurologic:  Normal speech and language. No gross focal neurologic deficits are appreciated. Speech is normal. No gait instability.  Skin:  Skin is warm, dry and intact. No rash noted. Except: Left hand second digit distal phalanx with superficial ulcerative appearing wound that is mildly erythematous, no induration, no fluctuance, no drainage, no bony tenderness, full range of motion, no motor or tendon deficits, normal distal capillary refill, nontender, wound present from  the base of the nail to the Palmer aspect of finger, nail well adhered.  Psychiatric: Mood and affect are normal. Speech and behavior are normal. Patient exhibits appropriate insight and judgment   ___________________________________________   LABS (all labs ordered are listed, but only abnormal results are displayed)  Labs Reviewed - No data to display ____________________________________________  PROCEDURES Procedures    INITIAL IMPRESSION / ASSESSMENT AND PLAN / ED COURSE  Pertinent labs & imaging results that were available during my care of the patient were reviewed by me and considered in my medical decision making (see chart for details).  Well-appearing patient. No acute distress. Presents for complaints of left second digit wound. Patient reports area initially presented as an insect bite, however reports gradual onset wound and states that she is just now coming in as a area still present. Area does appear to be healing however discussed in detail with patient suspect patient frequently irritating the area by cleaning and picking at the wound. Nontender and no trauma, patient declines xray. Counseled regarding proper wound care, cleaning with soap and  water, allowing open area time to allow area to dry. Will treat patient with topical Bactroban and oral Bactrim. Patient declines testing for RMSF or Lymes.. Discussed indication, risks and benefits of medications with patient. Counseled regarding close monitoring and follow up as needed.  Discussed follow up with Primary care physician this week. Discussed follow up and return parameters including no resolution or any worsening concerns. Patient verbalized understanding and agreed to plan.   ____________________________________________   FINAL CLINICAL IMPRESSION(S) / ED DIAGNOSES  Final diagnoses:  Open wound of left index finger without damage to nail, initial encounter     Discharge Medication List as of 04/30/2016 12:24  PM    START taking these medications   Details  sulfamethoxazole-trimethoprim (BACTRIM DS,SEPTRA DS) 800-160 MG tablet Take 1 tablet by mouth 2 (two) times daily., Starting Sat 04/30/2016, Normal        Note: This dictation was prepared with Dragon dictation along with smaller phrase technology. Any transcriptional errors that result from this process are unintentional.    Clinical Course      Joyce DillsLindsey Floyce Bujak, NP 04/30/16 1420    Joyce DillsLindsey Alissa Pharr, NP 04/30/16 1422

## 2016-04-30 NOTE — ED Triage Notes (Signed)
Unknown injury to left index finger 12 days ago. Redness, edema, and ulceration to tip of left index finger. Pt unsure of origin.

## 2016-04-30 NOTE — Discharge Instructions (Signed)
Take medication as prescribed. Keep clean and dry as discussed.  ° °Follow up with your primary care physician this week as needed. Return to Urgent care for new or worsening concerns.  ° °

## 2016-05-02 ENCOUNTER — Telehealth: Payer: Self-pay

## 2016-07-28 ENCOUNTER — Other Ambulatory Visit: Payer: Self-pay | Admitting: Obstetrics and Gynecology

## 2016-07-28 ENCOUNTER — Ambulatory Visit (INDEPENDENT_AMBULATORY_CARE_PROVIDER_SITE_OTHER): Payer: Medicaid Other | Admitting: Obstetrics and Gynecology

## 2016-07-28 ENCOUNTER — Ambulatory Visit (INDEPENDENT_AMBULATORY_CARE_PROVIDER_SITE_OTHER): Payer: Medicaid Other

## 2016-07-28 VITALS — BP 98/72 | HR 77 | Ht 66.0 in | Wt 174.1 lb

## 2016-07-28 DIAGNOSIS — Z87898 Personal history of other specified conditions: Secondary | ICD-10-CM

## 2016-07-28 DIAGNOSIS — M1990 Unspecified osteoarthritis, unspecified site: Secondary | ICD-10-CM | POA: Insufficient documentation

## 2016-07-28 DIAGNOSIS — D500 Iron deficiency anemia secondary to blood loss (chronic): Secondary | ICD-10-CM

## 2016-07-28 DIAGNOSIS — O09291 Supervision of pregnancy with other poor reproductive or obstetric history, first trimester: Secondary | ICD-10-CM | POA: Diagnosis not present

## 2016-07-28 DIAGNOSIS — Z3687 Encounter for antenatal screening for uncertain dates: Secondary | ICD-10-CM | POA: Diagnosis not present

## 2016-07-28 DIAGNOSIS — D509 Iron deficiency anemia, unspecified: Secondary | ICD-10-CM | POA: Insufficient documentation

## 2016-07-28 DIAGNOSIS — J301 Allergic rhinitis due to pollen: Secondary | ICD-10-CM

## 2016-07-28 DIAGNOSIS — T7589XA Other specified effects of external causes, initial encounter: Secondary | ICD-10-CM

## 2016-07-28 DIAGNOSIS — Z113 Encounter for screening for infections with a predominantly sexual mode of transmission: Secondary | ICD-10-CM

## 2016-07-28 DIAGNOSIS — Z1389 Encounter for screening for other disorder: Secondary | ICD-10-CM

## 2016-07-28 DIAGNOSIS — Z3481 Encounter for supervision of other normal pregnancy, first trimester: Secondary | ICD-10-CM

## 2016-07-28 DIAGNOSIS — F1011 Alcohol abuse, in remission: Secondary | ICD-10-CM | POA: Insufficient documentation

## 2016-07-28 DIAGNOSIS — I10 Essential (primary) hypertension: Secondary | ICD-10-CM | POA: Insufficient documentation

## 2016-07-28 DIAGNOSIS — J302 Other seasonal allergic rhinitis: Secondary | ICD-10-CM | POA: Insufficient documentation

## 2016-07-28 DIAGNOSIS — J4520 Mild intermittent asthma, uncomplicated: Secondary | ICD-10-CM | POA: Insufficient documentation

## 2016-07-28 NOTE — Patient Instructions (Signed)
Commonly Asked Questions During Pregnancy  Cats: A parasite can be excreted in cat feces.  To avoid exposure you need to have another person empty the little box.  If you must empty the litter box you will need to wear gloves.  Wash your hands after handling your cat.  This parasite can also be found in raw or undercooked meat so this should also be avoided.  Colds, Sore Throats, Flu: Please check your medication sheet to see what you can take for symptoms.  If your symptoms are unrelieved by these medications please call the office.  Dental Work: Most any dental work your dentist recommends is permitted.  X-rays should only be taken during the first trimester if absolutely necessary.  Your abdomen should be shielded with a lead apron during all x-rays.  Please notify your provider prior to receiving any x-rays.  Novocaine is fine; gas is not recommended.  If your dentist requires a note from us prior to dental work please call the office and we will provide one for you.  Exercise: Exercise is an important part of staying healthy during your pregnancy.  You may continue most exercises you were accustomed to prior to pregnancy.  Later in your pregnancy you will most likely notice you have difficulty with activities requiring balance like riding a bicycle.  It is important that you listen to your body and avoid activities that put you at a higher risk of falling.  Adequate rest and staying well hydrated are a must!  If you have questions about the safety of specific activities ask your provider.    Exposure to Children with illness: Try to avoid obvious exposure; report any symptoms to us when noted,  If you have chicken pos, red measles or mumps, you should be immune to these diseases.   Please do not take any vaccines while pregnant unless you have checked with your OB provider.  Fetal Movement: After 28 weeks we recommend you do "kick counts" twice daily.  Lie or sit down in a calm quiet environment and  count your baby movements "kicks".  You should feel your baby at least 10 times per hour.  If you have not felt 10 kicks within the first hour get up, walk around and have something sweet to eat or drink then repeat for an additional hour.  If count remains less than 10 per hour notify your provider.  Fumigating: Follow your pest control agent's advice as to how long to stay out of your home.  Ventilate the area well before re-entering.  Hemorrhoids:   Most over-the-counter preparations can be used during pregnancy.  Check your medication to see what is safe to use.  It is important to use a stool softener or fiber in your diet and to drink lots of liquids.  If hemorrhoids seem to be getting worse please call the office.   Hot Tubs:  Hot tubs Jacuzzis and saunas are not recommended while pregnant.  These increase your internal body temperature and should be avoided.  Intercourse:  Sexual intercourse is safe during pregnancy as long as you are comfortable, unless otherwise advised by your provider.  Spotting may occur after intercourse; report any bright red bleeding that is heavier than spotting.  Labor:  If you know that you are in labor, please go to the hospital.  If you are unsure, please call the office and let us help you decide what to do.  Lifting, straining, etc:  If your job requires heavy   lifting or straining please check with your provider for any limitations.  Generally, you should not lift items heavier than that you can lift simply with your hands and arms (no back muscles)  Painting:  Paint fumes do not harm your pregnancy, but may make you ill and should be avoided if possible.  Latex or water based paints have less odor than oils.  Use adequate ventilation while painting.  Permanents & Hair Color:  Chemicals in hair dyes are not recommended as they cause increase hair dryness which can increase hair loss during pregnancy.  " Highlighting" and permanents are allowed.  Dye may be  absorbed differently and permanents may not hold as well during pregnancy.  Sunbathing:  Use a sunscreen, as skin burns easily during pregnancy.  Drink plenty of fluids; avoid over heating.  Tanning Beds:  Because their possible side effects are still unknown, tanning beds are not recommended.  Ultrasound Scans:  Routine ultrasounds are performed at approximately 20 weeks.  You will be able to see your baby's general anatomy an if you would like to know the gender this can usually be determined as well.  If it is questionable when you conceived you may also receive an ultrasound early in your pregnancy for dating purposes.  Otherwise ultrasound exams are not routinely performed unless there is a medical necessity.  Although you can request a scan we ask that you pay for it when conducted because insurance does not cover " patient request" scans.  Work: If your pregnancy proceeds without complications you may work until your due date, unless your physician or employer advises otherwise.  Round Ligament Pain/Pelvic Discomfort:  Sharp, shooting pains not associated with bleeding are fairly common, usually occurring in the second trimester of pregnancy.  They tend to be worse when standing up or when you remain standing for long periods of time.  These are the result of pressure of certain pelvic ligaments called "round ligaments".  Rest, Tylenol and heat seem to be the most effective relief.  As the womb and fetus grow, they rise out of the pelvis and the discomfort improves.  Please notify the office if your pain seems different than that described.  It may represent a more serious condition.  First Trimester of Pregnancy The first trimester of pregnancy is from week 1 until the end of week 12 (months 1 through 3). During this time, your baby will begin to develop inside you. At 6-8 weeks, the eyes and face are formed, and the heartbeat can be seen on ultrasound. At the end of 12 weeks, all the baby's  organs are formed. Prenatal care is all the medical care you receive before the birth of your baby. Make sure you get good prenatal care and follow all of your doctor's instructions. Follow these instructions at home: Medicines  Take medicine only as told by your doctor. Some medicines are safe and some are not during pregnancy.  Take your prenatal vitamins as told by your doctor.  Take medicine that helps you poop (stool softener) as needed if your doctor says it is okay. Diet  Eat regular, healthy meals.  Your doctor will tell you the amount of weight gain that is right for you.  Avoid raw meat and uncooked cheese.  If you feel sick to your stomach (nauseous) or throw up (vomit):  Eat 4 or 5 small meals a day instead of 3 large meals.  Try eating a few soda crackers.  Drink liquids between meals   instead of during meals.  If you have a hard time pooping (constipation):  Eat high-fiber foods like fresh vegetables, fruit, and whole grains.  Drink enough fluids to keep your pee (urine) clear or pale yellow. Activity and Exercise  Exercise only as told by your doctor. Stop exercising if you have cramps or pain in your lower belly (abdomen) or low back.  Try to avoid standing for long periods of time. Move your legs often if you must stand in one place for a long time.  Avoid heavy lifting.  Wear low-heeled shoes. Sit and stand up straight.  You can have sex unless your doctor tells you not to. Relief of Pain or Discomfort  Wear a good support bra if your breasts are sore.  Take warm water baths (sitz baths) to soothe pain or discomfort caused by hemorrhoids. Use hemorrhoid cream if your doctor says it is okay.  Rest with your legs raised if you have leg cramps or low back pain.  Wear support hose if you have puffy, bulging veins (varicose veins) in your legs. Raise (elevate) your feet for 15 minutes, 3-4 times a day. Limit salt in your diet. Prenatal Care  Schedule  your prenatal visits by the twelfth week of pregnancy.  Write down your questions. Take them to your prenatal visits.  Keep all your prenatal visits as told by your doctor. Safety  Wear your seat belt at all times when driving.  Make a list of emergency phone numbers. The list should include numbers for family, friends, the hospital, and police and fire departments. General Tips  Ask your doctor for a referral to a local prenatal class. Begin classes no later than at the start of month 6 of your pregnancy.  Ask for help if you need counseling or help with nutrition. Your doctor can give you advice or tell you where to go for help.  Do not use hot tubs, steam rooms, or saunas.  Do not douche or use tampons or scented sanitary pads.  Do not cross your legs for long periods of time.  Avoid litter boxes and soil used by cats.  Avoid all smoking, herbs, and alcohol. Avoid drugs not approved by your doctor.  Do not use any tobacco products, including cigarettes, chewing tobacco, and electronic cigarettes. If you need help quitting, ask your doctor. You may get counseling or other support to help you quit.  Visit your dentist. At home, brush your teeth with a soft toothbrush. Be gentle when you floss. Get help if:  You are dizzy.  You have mild cramps or pressure in your lower belly.  You have a nagging pain in your belly area.  You continue to feel sick to your stomach, throw up, or have watery poop (diarrhea).  You have a bad smelling fluid coming from your vagina.  You have pain with peeing (urination).  You have increased puffiness (swelling) in your face, hands, legs, or ankles. Get help right away if:  You have a fever.  You are leaking fluid from your vagina.  You have spotting or bleeding from your vagina.  You have very bad belly cramping or pain.  You gain or lose weight rapidly.  You throw up blood. It may look like coffee grounds.  You are around people  who have German measles, fifth disease, or chickenpox.  You have a very bad headache.  You have shortness of breath.  You have any kind of trauma, such as from a fall or   a car accident. This information is not intended to replace advice given to you by your health care provider. Make sure you discuss any questions you have with your health care provider. Document Released: 01/11/2008 Document Revised: 12/31/2015 Document Reviewed: 06/04/2013 Elsevier Interactive Patient Education  2017 [employer] Hypertension Hypertension, commonly called high blood pressure, is when the force of blood pumping through your arteries is too strong. Your arteries are the blood vessels that carry blood from your heart throughout your body. A blood pressure reading consists of a higher number over a lower number, such as 110/72. The higher number (systolic) is the pressure inside your arteries when your heart pumps. The lower number (diastolic) is the pressure inside your arteries when your heart relaxes. Ideally you want your blood pressure below 120/80. Hypertension forces your heart to work harder to pump blood. Your arteries may become narrow or stiff. Having untreated or uncontrolled hypertension can cause heart attack, stroke, kidney disease, and other problems. What increases the risk? Some risk factors for high blood pressure are controllable. Others are not. Risk factors you cannot control include:  Race. You may be at higher risk if you are African American.  Age. Risk increases with age.  Gender. Men are at higher risk than women before age 45 years. After age 65, women are at higher risk than men. Risk factors you can control include:  Not getting enough exercise or physical activity.  Being overweight.  Getting too much fat, sugar, calories, or salt in your diet.  Drinking too much alcohol. What are the signs or symptoms? Hypertension does not usually cause signs or symptoms. Extremely  high blood pressure (hypertensive crisis) may cause headache, anxiety, shortness of breath, and nosebleed. How is this diagnosed? To check if you have hypertension, your health care provider will measure your blood pressure while you are seated, with your arm held at the level of your heart. It should be measured at least twice using the same arm. Certain conditions can cause a difference in blood pressure between your right and left arms. A blood pressure reading that is higher than normal on one occasion does not mean that you need treatment. If it is not clear whether you have high blood pressure, you may be asked to return on a different day to have your blood pressure checked again. Or, you may be asked to monitor your blood pressure at home for 1 or more weeks. How is this treated? Treating high blood pressure includes making lifestyle changes and possibly taking medicine. Living a healthy lifestyle can help lower high blood pressure. You may need to change some of your habits. Lifestyle changes may include:  Following the DASH diet. This diet is high in fruits, vegetables, and whole grains. It is low in salt, red meat, and added sugars.  Keep your sodium intake below 2,300 mg per day.  Getting at least 30-45 minutes of aerobic exercise at least 4 times per week.  Losing weight if necessary.  Not smoking.  Limiting alcoholic beverages.  Learning ways to reduce stress. Your health care provider may prescribe medicine if lifestyle changes are not enough to get your blood pressure under control, and if one of the following is true:  You are 18-59 years of age and your systolic blood pressure is above 140.  You are 60 years of age or older, and your systolic blood pressure is above 150.  Your diastolic blood pressure is above 90.  You have diabetes, and your   systolic blood pressure is over 140 or your diastolic blood pressure is over 90.  You have kidney disease and your blood  pressure is above 140/90.  You have heart disease and your blood pressure is above 140/90. Your personal target blood pressure may vary depending on your medical conditions, your age, and other factors. Follow these instructions at home:  Have your blood pressure rechecked as directed by your health care provider.  Take medicines only as directed by your health care provider. Follow the directions carefully. Blood pressure medicines must be taken as prescribed. The medicine does not work as well when you skip doses. Skipping doses also puts you at risk for problems.  Do not smoke.  Monitor your blood pressure at home as directed by your health care provider. Contact a health care provider if:  You think you are having a reaction to medicines taken.  You have recurrent headaches or feel dizzy.  You have swelling in your ankles.  You have trouble with your vision. Get help right away if:  You develop a severe headache or confusion.  You have unusual weakness, numbness, or feel faint.  You have severe chest or abdominal pain.  You vomit repeatedly.  You have trouble breathing. This information is not intended to replace advice given to you by your health care provider. Make sure you discuss any questions you have with your health care provider. Document Released: 07/25/2005 Document Revised: 12/31/2015 Document Reviewed: 05/17/2013 Elsevier Interactive Patient Education  2017 [employer] Minor Illnesses and Medications in Pregnancy  Cold/Flu:  Sudafed for congestion- Robitussin (plain) for cough- Tylenol for discomfort.  Please follow the directions on the label.  Try not to take any more than needed.  OTC Saline nasal spray and air humidifier or cool-mist  Vaporizer to sooth nasal irritation and to loosen congestion.  It is also important to increase intake of non carbonated fluids, especially if you have a fever.  Constipation:  Colace-2 capsules at bedtime; Metamucil-  follow directions on label; Senokot- 1 tablet at bedtime.  Any one of these medications can be used.  It is also very important to increase fluids and fruits along with regular exercise.  If problem persists please call the office.  Diarrhea:  Kaopectate as directed on the label.  Eat a bland diet and increase fluids.  Avoid highly seasoned foods.  Headache:  Tylenol 1 or 2 tablets every 3-4 hours as needed  Indigestion:  Maalox, Mylanta, Tums or Rolaids- as directed on label.  Also try to eat small meals and avoid fatty, greasy or spicy foods.  Nausea with or without Vomiting:  Nausea in pregnancy is caused by increased levels of hormones in the body which influence the digestive system and cause irritation when stomach acids accumulate.  Symptoms usually subside after 1st trimester of pregnancy.  Try the following: 1. Keep saltines, graham crackers or dry toast by your bed to eat upon awakening. 2. Don't let your stomach get empty.  Try to eat 5-6 small meals per day instead of 3 large ones. 3. Avoid greasy fatty or highly seasoned foods.  4. Take OTC Unisom 1 tablet at bed time along with OTC Vitamin B6 25-50 mg 3 times per day.    If nausea continues with vomiting and you are unable to keep down food and fluids you may need a prescription medication.  Please notify your provider.   Sore throat:  Chloraseptic spray, throat lozenges and or plain Tylenol.  Vaginal Yeast Infection:    OTC Monistat for 7 days as directed on label.  If symptoms do not resolve within a week notify provider.  If any of the above problems do not subside with recommended treatment please call the office for further assistance.   Do not take Aspirin, Advil, Motrin or Ibuprofen.  * * OTC= Over the counter Pregnancy and Zika Virus Disease Introduction Zika virus disease, or Zika, is an illness that can spread to people from mosquitoes that carry the virus. It may also spread from person to person through infected body  fluids. Zika first occurred in [place], but recently it has spread to new areas. The virus occurs in tropical climates. The location of Zika continues to change. Most people who become infected with Zika virus do not develop serious illness. However, Zika may cause birth defects in an unborn baby whose mother is infected with the virus. It may also increase the risk of miscarriage. What are the symptoms of Zika virus disease? In many cases, people who have been infected with Zika virus do not develop any symptoms. If symptoms appear, they usually start about a week after the person is infected. Symptoms are usually mild. They may include:  Fever.  Rash.  Red eyes.  Joint pain. How does Zika virus disease spread? The main way that Zika virus spreads is through the bite of a certain type of mosquito. Unlike most types of mosquitos, which bite only at night, the type of mosquito that carries Zika virus bites both at night and during the day. Zika virus can also spread through sexual contact, through a blood transfusion, and from a mother to her baby before or during birth. Once you have had Zika virus disease, it is unlikely that you will get it again. Can I pass Zika to my baby during pregnancy? Yes, Zika can pass from a mother to her baby before or during birth. What problems can Zika cause for my baby? A woman who is infected with Zika virus while pregnant is at risk of having her baby born with a condition in which the brain or head is smaller than expected (microcephaly). Babies who have microcephaly can have developmental delays, seizures, hearing problems, and vision problems. Having Zika virus disease during pregnancy can also increase the risk of miscarriage. How can Zika virus disease be prevented? There is no vaccine to prevent Zika. The best way to prevent the disease is to avoid infected mosquitoes and avoid exposure to body fluids that can spread the virus. Avoid any possible exposure  to Zika by taking the following precautions. For women and their sex partners:  Avoid traveling to high-risk areas. The locations where Zika is being reported change often. To identify high-risk areas, check the CDC travel website: www.cdc.gov/zika/geo/index.html  If you or your sex partner must travel to a high-risk area, talk with a health care provider before and after traveling.  Take all precautions to avoid mosquito bites if you live in, or travel to, any of the high-risk areas. Insect repellents are safe to use during pregnancy.  Ask your health care provider when it is safe to have sexual contact. For women:  If you are pregnant or trying to become pregnant, avoid sexual contact with persons who may have been exposed to Zika virus, persons who have possible symptoms of Zika, or persons whose history you are unsure about. If you choose to have sexual contact with someone who may have been exposed to Zika virus, use condoms correctly during the entire   duration of sexual activity, every time. Do not share sexual devices, as you may be exposed to body fluids.  Ask your health care provider about when it is safe to attempt pregnancy after a possible exposure to Zika virus. What steps should I take to avoid mosquito bites? Take these steps to avoid mosquito bites when you are in a high-risk area:  Wear loose clothing that covers your arms and legs.  Limit your outdoor activities.  Do not open windows unless they have window screens.  Sleep under mosquito nets.  Use insect repellent. The best insect repellents have:  DEET, picaridin, oil of lemon eucalyptus (OLE), or [id number] in them.  Higher amounts of an active ingredient in them.  Remember that insect repellents are safe to use during pregnancy.  Do not use OLE on children who are younger than 3 years of age. Do not use insect repellent on babies who are younger than 2 months of age.  Cover your child's stroller with mosquito  netting. Make sure the netting fits snugly and that any loose netting does not cover your child's mouth or nose. Do not use a blanket as a mosquito-protection cover.  Do not apply insect repellent underneath clothing.  If you are using sunscreen, apply the sunscreen before applying the insect repellent.  Treat clothing with permethrin. Do not apply permethrin directly to your skin. Follow label directions for safe use.  Get rid of standing water, where mosquitoes may reproduce. Standing water is often found in items such as buckets, bowls, animal food dishes, and flowerpots. When you return from traveling to any high-risk area, continue taking actions to protect yourself against mosquito bites for 3 weeks, even if you show no signs of illness. This will prevent spreading Zika virus to uninfected mosquitoes. What should I know about the sexual transmission of Zika? People can spread Zika to their sexual partners during vaginal, anal, or oral sex, or by sharing sexual devices. Many people with Zika do not develop symptoms, so a person could spread the disease without knowing that they are infected. The greatest risk is to women who are pregnant or who may become pregnant. Zika virus can live longer in semen than it can live in blood. Couples can prevent sexual transmission of the virus by:  Using condoms correctly during the entire duration of sexual activity, every time. This includes vaginal, anal, and oral sex.  Not sharing sexual devices. Sharing increases your risk of being exposed to body fluid from another person.  Avoiding all sexual activity until your health care provider says it is safe. Should I be tested for Zika virus? A sample of your blood can be tested for Zika virus. A pregnant woman should be tested if she may have been exposed to the virus or if she has symptoms of Zika. She may also have additional tests done during her pregnancy, such ultrasound testing. Talk with your health  care provider about which tests are recommended. This information is not intended to replace advice given to you by your health care provider. Make sure you discuss any questions you have with your health care provider. Document Released: 04/15/2015 Document Revised: 12/31/2015 Document Reviewed: 04/08/2015  2017 [employer]vier  

## 2016-07-28 NOTE — Progress Notes (Signed)
Joyce BlossomBrandie N Klett presents for NOB nurse interview visit. Pregnancy confirmation done 07/21/2016 at the [facility], Positive UPT. LMP: 06/07/2016 but pt is unsure. Could have been in September. Also has irregular periods.   G-4. P-2012. Pt states she had a still birth at 3-4 months. About 16 wks. Pt has had 2 preterm deliveries (26 wks and 34 wks). Also states she had a "bad" allergic reaction to the magnesim given to her with her when pregnant in hospital. Also they had difficulty with the epidural, this made her numb at the upper part of body and when redone she was numb all over. Pt c/o pain in her left big toe, thinks she has gout but no definitive diagnosis from MD. Also mentioned she got a spider bite on her finger and tick was found on her, removed, of which this was about 6 months ago. She has been treated for the spider bite with ATB. Her doctor said the tick bite areas was fine and had this checked several times. Now she states the tick bite area is red and itching. I advised that she would probably need to see her PCP for this.  Pt was taking Iron for Iron Def. Anemia but it made her sick. Pregnancy education material explained and given. Has cat in the home and has been changing litter box. Toxoplasmosis labs ordered. NOB labs ordered.  HIV labs and Drug screen were explained optional and she did not decline. Drug screen ordered. PNV encouraged. Genetic screening  to discuss with provider. Positive FHX Down's Syndrome and cerebral palsy.  Pt. To follow up with provider in 3 weeks for NOB physical.  To schedule appt for NOB labs to be done. To contact office if needed before NOB physical.  All questions answered. Ultrasound done today for dating and viability due to irregular periods, h/o PTL.  EGA: 8.4wks. EDD: 03/05/2017.

## 2016-08-02 NOTE — Progress Notes (Signed)
I have reviewed the record and concur with patient management and plan.  Hughey Rittenberry, MD [facility]  

## 2016-08-08 NOTE — L&D Delivery Note (Addendum)
Delivery Note   Joyce Gonzalez is a 41 y.o. [id number] at 6856w5d Estimated Date of Delivery: 03/05/17  PRE-OPERATIVE DIAGNOSIS:  1) 6456w5d pregnancy.   POST-OPERATIVE DIAGNOSIS:  1) 9156w5d pregnancy s/p Vaginal, Breech   2)  nonviable 17 week pregnancy-delivered.  (non-viable fetus - No APGAR scores)  Delivery Type: Vaginal, Breech    Delivery Clinician: Linzie CollinEVANS, Cliff Damiani JAMES   Delivery Anesthesia: Other   Labor Complications:     Additional complications: PPROM - labor at 17 wks  ESTIMATED BLOOD LOSS: 300 ml    FINDINGS:   1) female infant  2) Nuchal cord: yes - 3X - tight  SPECIMENS:   PLACENTA:   Appearance: Intact    Removal: Spontaneous      Disposition:   to pathology    NARRATIVE SUMMARY: Ms. [name] began leaking fluid and having contractions last evening. She states they progressively got worse through the night and she presented to the emergency department this morning. She was transferred to labor and delivery. Upon examination I noted umbilical cord and lower extremities in the vagina with no evidence of intact membranes. The cerclage was unable to be found because of the bleeding and fetal parts in the vagina. Patient was given intravenous pain medication for her regular contractions. While searching for the cerclage string the patient continued to have contractions and the infant presented as a footling breech. I was able to carefully deliver the infant. The cord was clamped and divided after reducing a tight triple nuchal cord. Soon thereafter the placenta was noted to be produced through the cervix and was grasped with a ring forceps and easily delivered intact. Pitocin was run in the IV. The patient's uterus became firm and her bleeding became normal post delivery. Cerclage string and button and both of these were removed. The cervix was carefully checked and no tears or lacerations were noted.    Elonda Huskyavid J. Lynford Espinoza, M.D. 09/30/2016 10:41 AM

## 2016-08-16 ENCOUNTER — Other Ambulatory Visit: Payer: Medicaid Other

## 2016-08-25 ENCOUNTER — Other Ambulatory Visit: Payer: Medicaid Other

## 2016-08-26 ENCOUNTER — Other Ambulatory Visit: Payer: Medicaid Other

## 2016-08-29 ENCOUNTER — Encounter: Payer: Medicaid Other | Admitting: Certified Nurse Midwife

## 2016-09-01 ENCOUNTER — Other Ambulatory Visit: Payer: Medicaid Other

## 2016-09-06 ENCOUNTER — Other Ambulatory Visit: Payer: Medicaid Other

## 2016-09-06 ENCOUNTER — Encounter: Payer: Medicaid Other | Admitting: Certified Nurse Midwife

## 2016-09-06 NOTE — Addendum Note (Signed)
Addended by: Lunette StandsSIEMIENSKI, Creola Krotz J on: 09/06/2016 12:49 PM   Modules accepted: Orders

## 2016-09-07 LAB — TOXOPLASMA ANTIBODIES- IGG AND  IGM: Toxoplasma IgG Ratio: <3 IU/mL (ref 0.0–7.1)

## 2016-09-07 LAB — CBC WITH DIFFERENTIAL/PLATELET
BASOS: 0 %
Basophils Absolute: 0 10*3/uL (ref 0.0–0.2)
EOS (ABSOLUTE): 0.2 10*3/uL (ref 0.0–0.4)
EOS: 2 %
HEMATOCRIT: 37.6 % (ref 34.0–46.6)
Hemoglobin: 12.6 g/dL (ref 11.1–15.9)
Immature Grans (Abs): 0 10*3/uL (ref 0.0–0.1)
Immature Granulocytes: 0 %
LYMPHS ABS: 1.9 10*3/uL (ref 0.7–3.1)
Lymphs: 21 %
MCH: 29 pg (ref 26.6–33.0)
MCHC: 33.5 g/dL (ref 31.5–35.7)
MCV: 87 fL (ref 79–97)
MONOS ABS: 0.5 10*3/uL (ref 0.1–0.9)
Monocytes: 5 %
NEUTROS ABS: 6.5 10*3/uL (ref 1.4–7.0)
Neutrophils: 72 %
Platelets: 235 10*3/uL (ref 150–379)
RBC: 4.34 x10E6/uL (ref 3.77–5.28)
RDW: 14.1 % (ref 12.3–15.4)
WBC: 9.1 10*3/uL (ref 3.4–10.8)

## 2016-09-07 LAB — RUBELLA SCREEN: RUBELLA: 23.7 {index} (ref >0.99)

## 2016-09-07 LAB — RH TYPE: Rh Factor: POSITIVE

## 2016-09-07 LAB — RPR: RPR Ser Ql: NONREACTIVE

## 2016-09-07 LAB — VARICELLA ZOSTER ANTIBODY, IGG: Varicella zoster IgG: 1130 index (ref >165)

## 2016-09-07 LAB — ANTIBODY SCREEN: Antibody Screen: NEGATIVE

## 2016-09-07 LAB — HEPATITIS B SURFACE ANTIGEN: Hepatitis B Surface Ag: NEGATIVE

## 2016-09-07 LAB — HIV ANTIBODY (ROUTINE TESTING W REFLEX): HIV Screen 4th Generation wRfx: NONREACTIVE

## 2016-09-07 LAB — ABO

## 2016-09-08 LAB — GC/CHLAMYDIA PROBE AMP
CHLAMYDIA, DNA PROBE: NEGATIVE
Neisseria gonorrhoeae by PCR: NEGATIVE

## 2016-09-08 LAB — CULTURE, OB URINE

## 2016-09-08 LAB — URINE CULTURE, OB REFLEX: Organism ID, Bacteria: NO GROWTH

## 2016-09-09 ENCOUNTER — Ambulatory Visit (INDEPENDENT_AMBULATORY_CARE_PROVIDER_SITE_OTHER): Payer: Medicaid Other | Admitting: Certified Nurse Midwife

## 2016-09-09 ENCOUNTER — Encounter: Payer: Self-pay | Admitting: Certified Nurse Midwife

## 2016-09-09 ENCOUNTER — Other Ambulatory Visit (INDEPENDENT_AMBULATORY_CARE_PROVIDER_SITE_OTHER): Payer: Medicaid Other

## 2016-09-09 VITALS — BP 111/68 | HR 85 | Wt 174.0 lb

## 2016-09-09 DIAGNOSIS — Z8751 Personal history of pre-term labor: Secondary | ICD-10-CM | POA: Diagnosis not present

## 2016-09-09 DIAGNOSIS — O09522 Supervision of elderly multigravida, second trimester: Secondary | ICD-10-CM

## 2016-09-09 DIAGNOSIS — F1011 Alcohol abuse, in remission: Secondary | ICD-10-CM

## 2016-09-09 DIAGNOSIS — Z23 Encounter for immunization: Secondary | ICD-10-CM

## 2016-09-09 DIAGNOSIS — Z3482 Encounter for supervision of other normal pregnancy, second trimester: Secondary | ICD-10-CM

## 2016-09-09 DIAGNOSIS — Z87898 Personal history of other specified conditions: Secondary | ICD-10-CM

## 2016-09-09 LAB — POCT URINALYSIS DIPSTICK
BILIRUBIN UA: NEGATIVE
GLUCOSE UA: NEGATIVE
KETONES UA: NEGATIVE
Leukocytes, UA: NEGATIVE
Nitrite, UA: NEGATIVE
Protein, UA: NEGATIVE
RBC UA: NEGATIVE
UROBILINOGEN UA: NEGATIVE
pH, UA: 8.5

## 2016-09-09 NOTE — Patient Instructions (Addendum)
Second Trimester of Pregnancy The second trimester is from week 13 through week 28, month 4 through 6. This is often the time in pregnancy that you feel your best. Often times, morning sickness has lessened or quit. You may have more energy, and you may get hungry more often. Your unborn baby (fetus) is growing rapidly. At the end of the sixth month, he or she is about 9 inches long and weighs about 1 pounds. You will likely feel the baby move (quickening) between 18 and 20 weeks of pregnancy. Follow these instructions at home:  Avoid all smoking, herbs, and alcohol. Avoid drugs not approved by your doctor.  Do not use any tobacco products, including cigarettes, chewing tobacco, and electronic cigarettes. If you need help quitting, ask your doctor. You may get counseling or other support to help you quit.  Only take medicine as told by your doctor. Some medicines are safe and some are not during pregnancy.  Exercise only as told by your doctor. Stop exercising if you start having cramps.  Eat regular, healthy meals.  Wear a good support bra if your breasts are tender.  Do not use hot tubs, steam rooms, or saunas.  Wear your seat belt when driving.  Avoid raw meat, uncooked cheese, and liter boxes and soil used by cats.  Take your prenatal vitamins.  Take 1500-2000 milligrams of calcium daily starting at the 20th week of pregnancy until you deliver your baby.  Try taking medicine that helps you poop (stool softener) as needed, and if your doctor approves. Eat more fiber by eating fresh fruit, vegetables, and whole grains. Drink enough fluids to keep your pee (urine) clear or pale yellow.  Take warm water baths (sitz baths) to soothe pain or discomfort caused by hemorrhoids. Use hemorrhoid cream if your doctor approves.  If you have puffy, bulging veins (varicose veins), wear support hose. Raise (elevate) your feet for 15 minutes, 3-4 times a day. Limit salt in your diet.  Avoid heavy  lifting, wear low heals, and sit up straight.  Rest with your legs raised if you have leg cramps or low back pain.  Visit your dentist if you have not gone during your pregnancy. Use a soft toothbrush to brush your teeth. Be gentle when you floss.  You can have sex (intercourse) unless your doctor tells you not to.  Go to your doctor visits. Get help if:  You feel dizzy.  You have mild cramps or pressure in your lower belly (abdomen).  You have a nagging pain in your belly area.  You continue to feel sick to your stomach (nauseous), throw up (vomit), or have watery poop (diarrhea).  You have bad smelling fluid coming from your vagina.  You have pain with peeing (urination). Get help right away if:  You have a fever.  You are leaking fluid from your vagina.  You have spotting or bleeding from your vagina.  You have severe belly cramping or pain.  You lose or gain weight rapidly.  You have trouble catching your breath and have chest pain.  You notice sudden or extreme puffiness (swelling) of your face, hands, ankles, feet, or legs.  You have not felt the baby move in over an hour.  You have severe headaches that do not go away with medicine.  You have vision changes. This information is not intended to replace advice given to you by your health care provider. Make sure you discuss any questions you have with your health care   provider. Document Released: 10/19/2009 Document Revised: 12/31/2015 Document Reviewed: 09/25/2012 Elsevier Interactive Patient Education  2017 Elsevier Inc.  Hydroxyprogesterone solution for injection What is this medicine? HYDROXYPROGESTERONE (hye drox ee proe JES ter one) is a female hormone. This medicine is used in women who are pregnant and who have delivered a baby too early (preterm) in the past. It helps lower the risk of having a preterm baby again. COMMON BRAND NAME(S): Makena What should I tell my health care provider before I take  this medicine? They need to know if you have any of these conditions: -blood clotting disorders -breast, cervical, uterine, or vaginal cancer -depression -diabetes or prediabetes -heart disease -high blood pressure -kidney disease -liver disease -lung or breathing disease, like asthma -migraine headaches -seizures -vaginal bleeding -an unusual or allergic reaction to hydroxyprogesterone, other hormones, medicines, foods, dyes, castor oil, benzyl alcohol, or other preservatives -breast-feeding How should I use this medicine? This medicine is for injection into a muscle. It is given by a health care professional in a hospital or clinic setting. You are likely to get an injection once a week to prevent preterm delivery. Talk to your pediatrician regarding the use of this medicine in children. Special care may be needed. What if I miss a dose? It is important not to miss your dose. Call your doctor or health care professional if you are unable to keep an appointment. What may interact with this medicine? -acetaminophen -bupropion -clozapine -efavirenz -halothane -methadone -nicotine -theophylline, aminophylline -tizanidine What should I watch for while using this medicine? Your condition will be monitored carefully while you are receiving this medicine. What side effects may I notice from receiving this medicine? Side effects that you should report to your doctor or health care professional as soon as possible: -allergic reactions like skin rash, itching or hives, swelling of the face, lips, or tongue -breathing problems -breast tissue changes or discharge -changes in vision -confusion, trouble speaking or understanding -depressed mood -increased hunger or thirst -increased urination -pain, redness, or irritation at site where injected -pain, swelling, warmth in the leg -shortness of breath, chest pain, swelling in a leg -sudden numbness or weakness of the face, arm or  leg -sudden severe headaches -trouble walking, dizziness, loss of balance or coordination -unusually weak or tired -vaginal bleeding -yellowing of the eyes or skin Side effects that usually do not require medical attention (report to your doctor or health care professional if they continue or are bothersome): -changes in emotions or moods -diarrhea -fluid retention and swelling -nausea Where should I keep my medicine? This drug is given in a hospital or clinic and will not be stored at home.  2017 Elsevier/Gold Standard (2009-09-15 11:17:12)

## 2016-09-09 NOTE — Progress Notes (Signed)
NOB Pe- bleeding after BM. Wants to know sex of baby.

## 2016-09-09 NOTE — Progress Notes (Signed)
NEW OB HISTORY AND PHYSICAL  SUBJECTIVE:       Joyce Gonzalez is a 41 y.o. [id number] female, Patient's last menstrual period was 06/07/2016 (approximate)., Estimated Date of Delivery: 03/05/17, [redacted]w[redacted]d, presents today for establishment of Prenatal Care. She is accompanied by her husband and mother.   Her breast tenderness is resolving. Reports bleeding after a bowel movement this morning. Denies nausea, vomiting, and abdominal pain.   History is significant for preterm births x 2 with limited prenatal care. Pt questions the need for a cerclage this pregnancy.   Pt works at [employer] and has questions about possible work restrictions.   Gynecologic History  Patient's last menstrual period was 06/07/2016 (approximate).   Last Pap: 2016. Results were: normal  Obstetric History OB History  Gravida Para Term Preterm AB Living  4 2 0 2 1 2   SAB TAB Ectopic Multiple Live Births  1 0 0 0 2    # Outcome Date GA Lbr Len/2nd Weight Sex Delivery Anes PTL Lv  4 Current           3 SAB 04/2004 [redacted]w[redacted]d   F Vag-Spont   ND  2 Preterm 03/16/04 [redacted]w[redacted]d  4 lb 3.2 oz (1.905 kg) F Vag-Spont EPI Y LIV  1 Preterm 09/09/02 [redacted]w[redacted]d  1 lb 2.1 oz (0.513 kg) F Vag-Spont  Y LIV    Obstetric Comments  04/2004 pt called this a stillbirth.    Past Medical History:  Diagnosis Date  . Amenorrhea   . Anxiety   . Hypertension   . Migraines     Past Surgical History:  Procedure Laterality Date  . MENISCUS REPAIR Right     Current Outpatient Prescriptions on File Prior to Visit  Medication Sig Dispense Refill  . albuterol (PROVENTIL HFA;VENTOLIN HFA) 108 (90 Base) MCG/ACT inhaler Inhale 1-2 puffs into the lungs every 6 (six) hours as needed for wheezing or shortness of breath. 1 Inhaler 0  . Prenatal Vit-Fe Fumarate-FA (PRENATAL VITAMINS) 28-0.8 MG TABS Take by mouth.     No current facility-administered medications on file prior to visit.     Allergies  Allergen Reactions  . Magnesium-Containing  Compounds     Pt had reaction when given this with pregnancy    Social History   Social History  . Marital status: Married    Spouse name: N/A  . Number of children: N/A  . Years of education: N/A   Occupational History  . Not on file.   Social History Main Topics  . Smoking status: Former Smoker    Packs/day: 0.50    Types: Cigarettes  . Smokeless tobacco: Never Used  . 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 Drug use: No  . Sexual activity: Yes    Partners: Male    Birth control/ protection: None   Other Topics Concern  . Not on file   Social History Narrative  . No narrative on file    Family History  Problem Relation Age of Onset  . Osteoarthritis Mother   . Asthma Mother   . Diabetes Mother   . Hyperlipidemia Mother   . Migraines Mother   . Diabetes Father   . Osteoarthritis Maternal Aunt   . Rheum arthritis Maternal Uncle   . Heart failure Maternal Uncle   . Hypertension Maternal Uncle   . Diabetes Paternal Uncle   . Asthma Maternal Grandmother   . Heart failure Maternal Grandmother   .  Diabetes Paternal Grandmother   . Rashes / Skin problems Daughter     eczema  . Alzheimer's disease Other     family history    The following portions of the patient's history were reviewed and updated as appropriate: allergies, current medications, past OB history, past medical history, past surgical history, past family history, past social history, and problem list.    OBJECTIVE: Initial Physical Exam (New OB)  GENERAL APPEARANCE: alert, well appearing, in no apparent distress  HEAD: normocephalic, atraumatic  MOUTH: mucous membranes moist, pharynx normal without lesions  THYROID: no thyromegaly or masses present  BREASTS: no masses noted, no significant tenderness, no palpable axillary nodes, no skin changes  LUNGS: clear to auscultation, no wheezes, rales or rhonchi, symmetric air entry  HEART: regular rate and  rhythm, no murmurs  ABDOMEN: soft, nontender, nondistended, no abnormal masses, no epigastric pain, fundus soft, nontender 14 weeks size and FHT present  EXTREMITIES: no redness or tenderness in the calves or thighs  SKIN: normal coloration and turgor, no rashes  LYMPH NODES: no adenopathy palpable  NEUROLOGIC: alert, oriented, normal speech, no focal findings or movement disorder noted  PELVIC EXAM EXTERNAL GENITALIA: normal appearing vulva with no masses, tenderness or lesions VAGINA: no abnormal discharge or lesions CERVIX: no lesions or cervical motion tenderness and dilation 1 cm UTERUS: gravid and consistent with 14 weeks ADNEXA: no masses palpable and nontender RECTUM: hemorrhoid - external  ASSESSMENT: High risk pregnancy Desires genetic screening History of preterm birth x 2  PLAN: Prenatal care, MD care Cervical length US today (see results below) Start Makena at 16 weeks Flu shot See orders   Gunnar BullaJenkins Michelle Frandy Basnett, CNM   ULTRASOUND REPORT  Location: [facility] Date of Service: 09/09/16  Indications: Cervical length for history of preterm births Findings:  Singleton intrauterine pregnancy is visualized with FHR at 157 BPM. Biometrics give an (U/S) Gestational age of [redacted] weeks and an (U/S) EDD of 03/03/17; this correlates with the clinically established EDD of 03/05/17.  Fetal presentation is vertex, spine anterior.  Placenta: Posterior, grade 0 AFI: Subjectively adequate  Anatomic survey of the fetal stomach appears WNL. Limited survey due to early gestational age. Gender -   .   Maternal cervix: (transvaginal approach)  without fundal pressure: 2.5 cm.  With fundal pressure: funneling is seen and cervical length shortens to 1.3 cm.  Impression: 1.  Funneling seen as well as a shortened cervix measuring 1.3 cm with fundal pressure. 2. 15 week Viable Singleton Intrauterine pregnancy by U/S. 3. (U/S) EDD is consistent with Clinically  established (LMP) EDD of 03/05/17.  US findings reviewed with Dr Valentino Saxonherry and she will assume care of patient.   Gunnar BullaJenkins Michelle Casaundra Takacs, CNM

## 2016-09-10 NOTE — Progress Notes (Signed)
    OBSTETRICS/GYNECOLOGY CLINIC PROGRESS NOTE   Assumed care of patient from Serafina RoyalsMichelle Lawhorn, CNM.   Joyce BlossomBrandie N Muscatello is a 41 y.o. [phone] female, with last menstrual period was 06/07/2016 (approximate), currently at 3860w4d, with Estimated Date of Delivery: 03/05/17.  The patient was initially seen for her NOB physical.  Patient has a significant history of preterm birth x 2 (at 4926 and 34 weeks), as well as a 2nd trimester loss.  During exam the patient's cervix was noted to be dilated (~ 1 cm).  Ultrasound findings noted funneling with cervical shortening (see below):     ULTRASOUND REPORT  Location: [facility] Date of Service: 09/09/16  Indications:Cervical length for history of preterm births Findings: Singleton intrauterine pregnancy is visualized with FHR at 157 BPM. Biometrics give an (U/S) Gestational age of [redacted] weeks and an (U/S) EDD of 03/03/17; this correlates with the clinically established EDD of 03/05/17.  Fetal presentation is vertex, spine anterior.  Placenta: Posterior, grade 0 AFI: Subjectively adequate  Anatomic survey of the fetal stomach appears WNL. Limited survey due to early gestational age. Gender - .   Maternal cervix: (transvaginal approach)  without fundal pressure: 2.5 cm.  With fundal pressure:funneling is seen and cervical length shortens to 1.3 cm.     Based on patient's history, she is a candidate for a cervical cerclage, as well as 17-OHP injections.  Discussion had with the patient regarding cerclage placement, including procedure, risks, benefits, recovery.  Recommended cerclage placement within the next week.  Patient ok to have surgery scheduled for Monday, 09/13/2015.  Pre-op done today.  Advised on limited activity over the next several days, and to avoid intercourse. Instructed on NPO status at midnight the evening prior to her surgery.  Patient notes understanding.    A total of 15 minutes were spent face-to-face with the  patient during this encounter and over half of that time dealt with counseling and coordination of care.   Hildred LaserAnika Joneric Streight, MD [facility]

## 2016-09-10 NOTE — Addendum Note (Signed)
Addended by: Fabian NovemberHERRY, Joyce Gonzalez on: 09/10/2016 10:48 PM   Modules accepted: Orders, SmartSet

## 2016-09-10 NOTE — H&P (Signed)
OBSTETRICS AND GYNECOLOGY PRE-OPERATIVE HISTORY AND PHYSICAL Subjective:    Patient is a 41 y.o. [id number] female at 382w6d, with Estimated Date of Delivery: 03/05/17 based on Patient's last menstrual period was 06/07/2016 (approximate).  She is scheduled for a McDonald cervical cerclage placement. Indications for procedure are h/o preterm birth x 2, h/o prior 2nd trimester loss, and cervical insufficiency noted on recent ultrasound and physical exam.   Pertinent Gynecological History:  Patient's last menstrual period was 06/07/2016 (approximate).   Last Pap: 2016. Results were: normal  Discussed Blood/Blood Products: no   Menstrual History: OB History    Gravida Para Term Preterm AB Living   4 2 0 2 1 2    SAB TAB Ectopic Multiple Live Births   1 0 0 0 2      Obstetric Comments   04/2004 pt called this a stillbirth.       OB History  Gravida Para Term Preterm AB Living  4 2 0 2 1 2   SAB TAB Ectopic Multiple Live Births  1 0 0 0 2    # Outcome Date GA Lbr Len/2nd Weight Sex Delivery Anes PTL Lv  4 Current           3 SAB 04/2004 1248w0d   F Vag-Spont   ND  2 Preterm 03/16/04 1222w0d  4 lb 3.2 oz (1.905 kg) F Vag-Spont EPI Y LIV  1 Preterm 09/09/02 7646w0d  1 lb 2.1 oz (0.513 kg) F Vag-Spont  Y LIV    Obstetric Comments  04/2004 pt called this a stillbirth.    Past Medical History:  Diagnosis Date  . Amenorrhea   . Anxiety   . Hypertension   . Migraines     Past Surgical History:  Procedure Laterality Date  . MENISCUS REPAIR Right     Family History  Problem Relation Age of Onset  . Osteoarthritis Mother   . Asthma Mother   . Diabetes Mother   . Hyperlipidemia Mother   . Migraines Mother   . Diabetes Father   . Osteoarthritis Maternal Aunt   . Rheum arthritis Maternal Uncle   . Heart failure Maternal Uncle   . Hypertension Maternal Uncle   . Diabetes Paternal Uncle   . Asthma Maternal Grandmother   . Heart failure Maternal Grandmother   . Diabetes  Paternal Grandmother   . Rashes / Skin problems Daughter     eczema  . Alzheimer's disease Other     family history    Social History   Social History  . Marital status: Married    Spouse name: N/A  . Number of children: N/A  . Years of education: N/A   Social History Main Topics  . Smoking status: Former Smoker    Packs/day: 0.50    Types: Cigarettes  . Smokeless tobacco: Never Used  . 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 Drug use: No  . Sexual activity: Yes    Partners: Male    Birth control/ protection: None   Other Topics Concern  . None   Social History Narrative  . None     Current Outpatient Prescriptions on File Prior to Visit  Medication Sig Dispense Refill  . albuterol (PROVENTIL HFA;VENTOLIN HFA) 108 (90 Base) MCG/ACT inhaler Inhale 1-2 puffs into the lungs every 6 (six) hours as needed for wheezing or shortness of breath. 1 Inhaler 0  . Prenatal Vit-Fe Fumarate-FA (PRENATAL VITAMINS) 28-0.8  MG TABS Take by mouth.     No current facility-administered medications on file prior to visit.      Allergies  Allergen Reactions  . Magnesium-Containing Compounds     Pt had reaction when given this with pregnancy    Review of Systems Constitutional: No recent fever/chills/sweats Respiratory: No recent cough/bronchitis Cardiovascular: No chest pain Gastrointestinal: No recent nausea/vomiting/diarrhea Genitourinary: No UTI symptoms Hematologic/lymphatic:No history of coagulopathy or recent blood thinner use    Objective:    BP 111/68   Pulse 85   Wt 174 lb (78.9 kg)   LMP 06/07/2016 (Approximate) Comment: unsure of lmp, either Oct. or Sept.  BMI 28.08 kg/m   General:   Normal  Skin:   normal  HEENT:  Normal  Neck:  Supple without Adenopathy or Thyromegaly  Lungs:   Heart:              Breasts:   Abdomen:  Pelvis:  M/S   Extremeties:  Neuro:    clear to auscultation bilaterally   Normal without murmur     Not Examined   soft, non-tender; bowel sounds normal; no masses,  no organomegaly   Exam deferred to OR  No CVAT  Warm/Dry   Normal           Imaging:  ULTRASOUND REPORT  Location: [facility] Date of Service: 09/09/16  Indications:Cervical length for history of preterm births Findings: Singleton intrauterine pregnancy is visualized with FHR at 157 BPM. Biometrics give an (U/S) Gestational age of [redacted] weeks and an (U/S) EDD of 03/03/17; this correlates with the clinically established EDD of 03/05/17.  Fetal presentation is vertex, spine anterior.  Placenta: Posterior, grade 0 AFI: Subjectively adequate  Anatomic survey of the fetal stomach appears WNL. Limited survey due to early gestational age. Gender - .   Maternal cervix: (transvaginal approach)  without fundal pressure: 2.5 cm.  With fundal pressure:funneling is seen and cervical length shortens to 1.3 cm.  Impression: 1. Funneling seen as well as a shortened cervix measuring 1.3 cm with fundal pressure. 2. 15 week Viable Singleton Intrauterine pregnancy by U/S. 3. (U/S) EDD is consistent with Clinically established (LMP) EDD of 03/05/17.   Assessment:    Cervical shortening (insufficiency) H/o preterm delivery x2 H/o 2nd trimester loss  Plan:    Counseling: Procedure, risks, reasons, benefits and complications (including injury to rectum, bladder, major blood vessel, ureter, bleeding, possibility of infection, ruptured membranes, pregnancy loss reviewed in detail. Consent signed. Preop testing ordered. Instructions reviewed, including NPO after midnight.   Hildred Laser, MD [facility]

## 2016-09-12 ENCOUNTER — Encounter: Payer: Self-pay | Admitting: *Deleted

## 2016-09-12 ENCOUNTER — Ambulatory Visit
Admission: RE | Admit: 2016-09-12 | Discharge: 2016-09-12 | Disposition: A | Discharge status: Home / Self Care | Payer: Medicaid Other | Source: Ambulatory Visit | Attending: Obstetrics and Gynecology | Admitting: Obstetrics and Gynecology

## 2016-09-12 ENCOUNTER — Ambulatory Visit: Payer: Medicaid Other | Admitting: Registered Nurse

## 2016-09-12 ENCOUNTER — Encounter: Payer: Self-pay | Admitting: Obstetrics and Gynecology

## 2016-09-12 ENCOUNTER — Encounter
Admission: RE | Disposition: A | Discharge status: Home / Self Care | Payer: Self-pay | Source: Ambulatory Visit | Attending: Obstetrics and Gynecology

## 2016-09-12 DIAGNOSIS — Z3A15 15 weeks gestation of pregnancy: Secondary | ICD-10-CM | POA: Diagnosis not present

## 2016-09-12 DIAGNOSIS — O162 Unspecified maternal hypertension, second trimester: Secondary | ICD-10-CM | POA: Diagnosis not present

## 2016-09-12 DIAGNOSIS — O3432 Maternal care for cervical incompetence, second trimester: Secondary | ICD-10-CM

## 2016-09-12 DIAGNOSIS — O99512 Diseases of the respiratory system complicating pregnancy, second trimester: Secondary | ICD-10-CM | POA: Insufficient documentation

## 2016-09-12 DIAGNOSIS — J45909 Unspecified asthma, uncomplicated: Secondary | ICD-10-CM | POA: Diagnosis not present

## 2016-09-12 DIAGNOSIS — O26872 Cervical shortening, second trimester: Secondary | ICD-10-CM | POA: Insufficient documentation

## 2016-09-12 DIAGNOSIS — Z87891 Personal history of nicotine dependence: Secondary | ICD-10-CM | POA: Insufficient documentation

## 2016-09-12 DIAGNOSIS — Z8751 Personal history of pre-term labor: Secondary | ICD-10-CM | POA: Diagnosis not present

## 2016-09-12 HISTORY — PX: CERVICAL CERCLAGE: SHX1329

## 2016-09-12 LAB — CBC
HEMATOCRIT: 34.8 % — AB (ref 35.0–47.0)
Hemoglobin: 11.8 g/dL — ABNORMAL LOW (ref 12.0–16.0)
MCH: 29.8 pg (ref 26.0–34.0)
MCHC: 33.9 g/dL (ref 32.0–36.0)
MCV: 87.9 fL (ref 80.0–100.0)
Platelets: 268 10*3/uL (ref 150–440)
RBC: 3.96 MIL/uL (ref 3.80–5.20)
RDW: 14.1 % (ref 11.5–14.5)
WBC: 13.6 10*3/uL — AB (ref 3.6–11.0)

## 2016-09-12 SURGERY — CERCLAGE, CERVIX, VAGINAL APPROACH
Anesthesia: Spinal | Site: Cervix | Wound class: Clean Contaminated

## 2016-09-12 MED ORDER — HYDROCODONE-ACETAMINOPHEN 5-325 MG PO TABS: 1.0000 | ORAL_TABLET | Freq: Four times a day (QID) | ORAL | 0 refills | Status: DC | PRN

## 2016-09-12 MED ORDER — LIDOCAINE HCL (CARDIAC) 20 MG/ML IV SOLN
INTRAVENOUS | Status: DC | PRN
  Administered 2016-09-12: 30 mg via INTRAVENOUS

## 2016-09-12 MED ORDER — LACTATED RINGERS IV SOLN
INTRAVENOUS | Status: DC
  Administered 2016-09-12 (×2): via INTRAVENOUS

## 2016-09-12 MED ORDER — FENTANYL CITRATE (PF) 100 MCG/2ML IJ SOLN
INTRAMUSCULAR | Status: DC | PRN
  Administered 2016-09-12 (×2): 50 ug via INTRAVENOUS

## 2016-09-12 MED ORDER — LIDOCAINE HCL (PF) 2 % IJ SOLN
INTRAMUSCULAR | Status: AC
  Filled 2016-09-12: qty 2

## 2016-09-12 MED ORDER — PROPOFOL 500 MG/50ML IV EMUL
INTRAVENOUS | Status: DC | PRN
  Administered 2016-09-12: 120 ug/kg/min via INTRAVENOUS

## 2016-09-12 MED ORDER — PROPOFOL 10 MG/ML IV BOLUS
INTRAVENOUS | Status: DC | PRN
  Administered 2016-09-12: 30 mg via INTRAVENOUS
  Administered 2016-09-12 (×2): 50 mg via INTRAVENOUS
  Administered 2016-09-12: 30 mg via INTRAVENOUS
  Administered 2016-09-12: 40 mg via INTRAVENOUS

## 2016-09-12 MED ORDER — PROPOFOL 10 MG/ML IV BOLUS
INTRAVENOUS | Status: AC
  Filled 2016-09-12: qty 20

## 2016-09-12 MED ORDER — BUPIVACAINE HCL (PF) 0.75 % IJ SOLN
INTRAMUSCULAR | Status: DC | PRN
  Administered 2016-09-12: 1.5 mL

## 2016-09-12 MED ORDER — FENTANYL CITRATE (PF) 100 MCG/2ML IJ SOLN
INTRAMUSCULAR | Status: AC
  Filled 2016-09-12: qty 2

## 2016-09-12 MED ORDER — ONDANSETRON HCL 4 MG/2ML IJ SOLN: 4.0000 mg | Freq: Once | INTRAMUSCULAR | Status: DC | PRN

## 2016-09-12 MED ORDER — KETOROLAC TROMETHAMINE 30 MG/ML IJ SOLN
INTRAMUSCULAR | Status: AC
  Filled 2016-09-12: qty 1

## 2016-09-12 MED ORDER — AMPICILLIN 250 MG PO CAPS: 250.0000 mg | ORAL_CAPSULE | Freq: Four times a day (QID) | ORAL | 0 refills | Status: DC

## 2016-09-12 MED ORDER — ROCURONIUM BROMIDE 50 MG/5ML IV SOSY
PREFILLED_SYRINGE | INTRAVENOUS | Status: AC
  Filled 2016-09-12: qty 5

## 2016-09-12 MED ORDER — CEFAZOLIN SODIUM-DEXTROSE 2-4 GM/100ML-% IV SOLN: 2.0000 g | Freq: Once | INTRAVENOUS | Status: DC

## 2016-09-12 MED ORDER — FENTANYL CITRATE (PF) 100 MCG/2ML IJ SOLN: 25.0000 ug | INTRAMUSCULAR | Status: DC | PRN

## 2016-09-12 MED ORDER — GLYCOPYRROLATE 0.2 MG/ML IJ SOLN
INTRAMUSCULAR | Status: DC | PRN
  Administered 2016-09-12: 0.2 mg via INTRAVENOUS

## 2016-09-12 SURGICAL SUPPLY — 19 items
CATH ROBINSON RED A/P 16FR (CATHETERS) ×3 IMPLANT
DRAPE PERI LITHO V/GYN (MISCELLANEOUS) ×3 IMPLANT
DRAPE UNDER BUTTOCK W/FLU (DRAPES) ×3 IMPLANT
ELECT REM PT RETURN 9FT ADLT (ELECTROSURGICAL) ×3
ELECTRODE REM PT RTRN 9FT ADLT (ELECTROSURGICAL) ×1 IMPLANT
GLOVE BIO SURGEON STRL SZ 6.5 (GLOVE) ×2 IMPLANT
GLOVE BIO SURGEONS STRL SZ 6.5 (GLOVE) ×1
GLOVE INDICATOR 7.0 STRL GRN (GLOVE) ×3 IMPLANT
GOWN STRL REUS W/ TWL LRG LVL3 (GOWN DISPOSABLE) ×1 IMPLANT
GOWN STRL REUS W/TWL LRG LVL3 (GOWN DISPOSABLE) ×2
KIT RM TURNOVER CYSTO AR (KITS) ×3 IMPLANT
LABEL OR SOLS (LABEL) ×3 IMPLANT
PACK BASIN MINOR ARMC (MISCELLANEOUS) ×3 IMPLANT
PAD OB MATERNITY 4.3X12.25 (PERSONAL CARE ITEMS) ×3 IMPLANT
PAD PREP 24X41 OB/GYN DISP (PERSONAL CARE ITEMS) ×3 IMPLANT
Poly suture button IMPLANT
SUT ETHIBOND NAB CT1 #1 30IN (SUTURE) ×6 IMPLANT
SUT POLY BUTTON 15MM (SUTURE) ×3 IMPLANT
Suture Button IMPLANT

## 2016-09-12 NOTE — H&P (Signed)
UPDATE TO PREVIOUS HISTORY AND PHYSICAL  The patient has been seen and examined.  H&P is up to date, no changes noted.  Patient is a .41 y.o.  [id number] female at 625w1d who presents for cervical cerclage placement for h/o preterm birth x 2, h/o prior 2nd trimester loss, and cervical insufficiency noted on recent ultrasound and physical exam. All questions answered.  FHT appreciated, 159 bpm.  Patient can proceed to the OR for scheduled procedure. Will reassess FHT post-procedure.   Hildred LaserAnika Persephone Schriever, MD 09/12/2016 4:15 PM

## 2016-09-12 NOTE — Anesthesia Post-op Follow-up Note (Cosign Needed)
Anesthesia QCDR form completed.        

## 2016-09-12 NOTE — Discharge Instructions (Signed)
AMBULATORY SURGERY  DISCHARGE INSTRUCTIONS   1) The drugs that you were given will stay in your system until tomorrow so for the next 24 hours you should not:  A) Drive an automobile B) Make any legal decisions C) Drink any alcoholic beverage   2) You may resume regular meals tomorrow.  Today it is better to start with liquids and gradually work up to solid foods.  You may eat anything you prefer, but it is better to start with liquids, then soup and crackers, and gradually work up to solid foods.   3) Please notify your doctor immediately if you have any unusual bleeding, trouble breathing, redness and pain at the surgery site, drainage, fever, or pain not relieved by medication.    4) Additional Instructions:        Please contact your physician with any problems or Same Day Surgery at [phone], Monday through Friday 6 am to 4 pm, or [place] at [facility] Main number at [phone]  .     Cervical Cerclage, Care After This sheet gives you information about how to care for yourself after your procedure. Your health care provider may also give you more specific instructions. If you have problems or questions, contact your health care provider. What can I expect after the procedure? After your procedure, it is common to have:  Cramping in your abdomen.  Mucus discharge for several days.  Painful urination (dysuria).  Small drops of blood coming from your vagina (spotting). Follow these instructions at home:  Follow instructions from your health care provider about bed rest, if this applies. You may need to be on bed rest for up to 3 days.  Take over-the-counter and prescription medicines only as told by your health care provider.  Do not drive or use heavy machinery while taking prescription pain medicine.  Keep track of your vaginal discharge and watch for any changes. If you notice changes, tell your health care provider.  Avoid physical activities and  exercise until your health care provider approves. Ask your health care provider what activities are safe for you.  Until your health care provider approves:  Do not douche.  Do not have sexual intercourse.  Keep all pre-birth (prenatal) visits and all follow-up visits as told by your health care provider. This is important. You will probably have weekly visits to have your cervix checked, and you may need an ultrasound. Contact a health care provider if:  You have abnormal or bad-smelling vaginal discharge, such as clots.  You develop a rash on your skin. This may look like redness and swelling.  You become light-headed or feel like you are going to faint.  You have abdominal pain that does not get better with medicine.  You have persistent nausea or vomiting. Get help right away if:  You have vaginal bleeding that is heavier or more frequent than spotting.  You are leaking fluid or have a gush of fluid from your vagina (your water breaks).  You have a fever or chills.  You faint.  You have uterine contractions. These may feel like:  A back ache.  Lower abdominal pain.  Mild cramps, similar to menstrual cramps.  Tightening or pressure in your abdomen.  You think that your baby is not moving as much as usual, or you cannot feel your baby move.  You have chest pain.  You have shortness of breath. This information is not intended to replace advice given to you by your health care provider.  Make sure you discuss any questions you have with your health care provider. Document Released: 05/15/2013 Document Revised: 03/23/2016 Document Reviewed: 02/26/2016 Elsevier Interactive Patient Education  2017 [employer].

## 2016-09-12 NOTE — Transfer of Care (Signed)
Immediate Anesthesia Transfer of Care Note  Patient: Joyce Gonzalez  Procedure(s) Performed: Procedure(s): CERCLAGE CERVICAL (N/A)  Patient Location: PACU  Anesthesia Type:Spinal  Level of Consciousness: awake, alert  and oriented  Airway & Oxygen Therapy: Patient Spontanous Breathing  Post-op Assessment: Report given to RN and Post -op Vital signs reviewed and stable  Post vital signs: Reviewed and stable  Last Vitals:  Vitals:   09/12/16 1423  BP: 128/74  Pulse: 85  Resp: 14  Temp: 37.2 C    Last Pain:  Vitals:   09/12/16 1423  TempSrc: Tympanic         Complications: No apparent anesthesia complications

## 2016-09-12 NOTE — OR Nursing (Signed)
Patient denies any cramping no vaginal bleeding noted on peri-pad

## 2016-09-12 NOTE — Progress Notes (Signed)
Pt able to ambulate with 1 assist to bathroom to void.  NAD.  Denies pain.  Safety maintained.

## 2016-09-12 NOTE — Anesthesia Procedure Notes (Signed)
Spinal  Patient location during procedure: OR Staffing Anesthesiologist: Berdine AddisonHOMAS, Wilhelm Ganaway Performed: anesthesiologist  Preanesthetic Checklist Completed: patient identified, site marked, surgical consent, pre-op evaluation, timeout performed, IV checked and risks and benefits discussed Spinal Block Patient position: sitting Prep: Betadine Patient monitoring: heart rate, cardiac monitor, continuous pulse ox and blood pressure Approach: midline Location: L3-4 Injection technique: single-shot Needle Needle type: Pencil-Tip  Needle gauge: 25 G Needle length: 9 cm Assessment Sensory level: T10 Additional Notes 1639 marcaine 1.655ml.

## 2016-09-12 NOTE — Anesthesia Preprocedure Evaluation (Signed)
Anesthesia Evaluation  Patient identified by MRN, date of birth, ID band Patient awake    Reviewed: Allergy & Precautions, NPO status , Patient's Chart, lab work & pertinent test results, reviewed documented beta blocker date and time   Airway Mallampati: II  TM Distance: >3 FB     Dental  (+) Chipped   Pulmonary asthma , former smoker,           Cardiovascular hypertension, Pt. on medications      Neuro/Psych  Headaches, Anxiety    GI/Hepatic   Endo/Other    Renal/GU      Musculoskeletal  (+) Arthritis ,   Abdominal   Peds  Hematology  (+) anemia ,   Anesthesia Other Findings   Reproductive/Obstetrics                             Anesthesia Physical Anesthesia Plan  ASA: II  Anesthesia Plan: Spinal   Post-op Pain Management:    Induction:   Airway Management Planned:   Additional Equipment:   Intra-op Plan:   Post-operative Plan:   Informed Consent: I have reviewed the patients History and Physical, chart, labs and discussed the procedure including the risks, benefits and alternatives for the proposed anesthesia with the patient or authorized representative who has indicated his/her understanding and acceptance.     Plan Discussed with: CRNA  Anesthesia Plan Comments:         Anesthesia Quick Evaluation

## 2016-09-12 NOTE — Progress Notes (Signed)
Pt resting quietly in bed.  States she has feeling in lower extremities.  Pt is able to wiggle toes and raise hips off bed.  Pt is not able to stand when assisted by 2 RNs.  Pt back in bed.  NAD,  Denies pain.  Safety maintained.

## 2016-09-12 NOTE — Op Note (Signed)
Procedure(s): CERCLAGE CERVICAL Procedure Note  Joyce Gonzalez female 41 y.o. 09/12/2016  Indications: The patient is a 41 y.o. [phone] female at 45100w1d who presents for cervical cerclage placement for h/o preterm birth x 2, h/o prior 2nd trimester loss, and cervical insufficiency noted on recent ultrasound and physical exam.  Pre-operative Diagnosis:  h/o preterm birth x 2, h/o prior 2nd trimester loss, and cervical insufficiency   Post-operative Diagnosis: Same  Surgeon: Hildred LaserAnika Ojani Berenson, MD  Assistants: Sharon SellerMartin DeFrancesco, MD   Anesthesia: General endotracheal anesthesia  Findings: Normal external genitalia.  Cervix visibly dilated, ~ 1 cm.  Procedure Details: The patient was seen in the Holding Room. The risks, benefits, complications, treatment options, and expected outcomes were discussed with the patient.  The patient concurred with the proposed plan, giving informed consent.  The site of surgery properly noted/marked. The patient was taken to the Operating Room, identified as Joyce Gonzalez and the procedure verified as Procedure(s) (LRB): CERCLAGE CERVICAL (N/A). A Time Out was held and the above information confirmed.  She was then placed under spinal anesthesia without difficulty. She was placed in the dorsal lithotomy position, and was prepped and draped in a sterile manner.  A straight catheterization was performed. A weighted speculum was inserted into the vagina and a Sims retractor was inserted to visualize the cervix.   The cervix was noted to be visibly dilated, ~ 1 cm with mucoid discharge.  The cervix was grasped with Debakey pickups and using a1-0 Ethibond suture, the first bite of the McDonald stitch is placed at the 12 o'clock position on the cervix at the junction of the vaginal mucosa and portio of the cervix at the level of the internal os. Repeated pursestring sutures were placed in 4 bites  around the cervix at the level of the internal os.  The pursestring  suture was tied using using a sterile button tied at the 12 o'clock position.  All instruments were removed from the vagina.   The patient tolerated the procedure well.  She was taken to the recovery room awake, and in stable condition.    Estimated Blood Loss:  10 ml       Drains: straight catheterization prior to procedure with  75 ml of clear urine         Total IV Fluids:  700  ml  Specimens: None         Implants: None         Complications:  None; patient tolerated the procedure well.         Disposition: PACU - hemodynamically stable.         Condition: stable   Hildred LaserAnika Chet Greenley, MD [facility]

## 2016-09-13 ENCOUNTER — Encounter: Payer: Self-pay | Admitting: Obstetrics and Gynecology

## 2016-09-13 ENCOUNTER — Telehealth: Payer: Self-pay

## 2016-09-13 NOTE — Telephone Encounter (Signed)
Left message to contact office. (pt needs to start Hydroxyprogesteron 17p on Monday, she will be 16 wks., the medication is in office.)

## 2016-09-13 NOTE — Anesthesia Postprocedure Evaluation (Signed)
Anesthesia Post Note  Patient: Joyce Gonzalez  Procedure(s) Performed: Procedure(s) (LRB): CERCLAGE CERVICAL (N/A)  Patient location during evaluation: PACU Anesthesia Type: Spinal Level of consciousness: oriented and awake and alert Pain management: pain level controlled Vital Signs Assessment: post-procedure vital signs reviewed and stable Respiratory status: spontaneous breathing, respiratory function stable and patient connected to nasal cannula oxygen Cardiovascular status: blood pressure returned to baseline and stable Postop Assessment: no headache and no backache Anesthetic complications: no     Last Vitals:  Vitals:   09/12/16 1820 09/12/16 1855  BP: (!) 121/47 121/79  Pulse: 77 83  Resp: 18 18  Temp: 36.7 C 36.9 C    Last Pain:  Vitals:   09/12/16 2019  TempSrc:   PainSc: 0-No pain                 Roberth Berling S

## 2016-09-15 NOTE — Telephone Encounter (Signed)
Pt has appt scheduled next week for visit with MD and injection.

## 2016-09-16 ENCOUNTER — Telehealth: Payer: Self-pay | Admitting: Obstetrics and Gynecology

## 2016-09-16 NOTE — Telephone Encounter (Signed)
Patient needs a more specific note for work. She needs the note to say she can return to work on Sunday but only a half day and can return to regular schedule on Monday 09/19/16. Thanks

## 2016-09-16 NOTE — Telephone Encounter (Signed)
Letter sent to patient via mychart as well as printed and left copy at the front desk.

## 2016-09-17 LAB — MONITOR DRUG PROFILE 14(MW)
AMPHETAMINE SCREEN URINE: NEGATIVE ng/mL
BARBITURATE SCREEN URINE: NEGATIVE ng/mL
BENZODIAZEPINE SCREEN, URINE: NEGATIVE ng/mL
Buprenorphine, Urine: NEGATIVE ng/mL
CREATININE(CRT), U: 49.2 mg/dL (ref 20.0–300.0)
Cocaine (Metab) Scrn, Ur: NEGATIVE ng/mL
Fentanyl, Urine: NEGATIVE pg/mL
Meperidine Screen, Urine: NEGATIVE ng/mL
Methadone Screen, Urine: NEGATIVE ng/mL
OPIATE SCREEN URINE: NEGATIVE ng/mL
OXYCODONE+OXYMORPHONE UR QL SCN: NEGATIVE ng/mL
Ph of Urine: 7.8 (ref 4.5–8.9)
Phencyclidine Qn, Ur: NEGATIVE ng/mL
Propoxyphene Scrn, Ur: NEGATIVE ng/mL
SPECIFIC GRAVITY: 1.012
TRAMADOL SCREEN, URINE: NEGATIVE ng/mL

## 2016-09-17 LAB — MATERNIT 21 PLUS CORE, BLOOD
CHROMOSOME 13: NEGATIVE
CHROMOSOME 18: NEGATIVE
CHROMOSOME 21: NEGATIVE
Y Chromosome: NOT DETECTED

## 2016-09-17 LAB — URINALYSIS, ROUTINE W REFLEX MICROSCOPIC
Bilirubin, UA: NEGATIVE
GLUCOSE, UA: NEGATIVE
Ketones, UA: NEGATIVE
Leukocytes, UA: NEGATIVE
NITRITE UA: NEGATIVE
Protein, UA: NEGATIVE
RBC UA: NEGATIVE
SPEC GRAV UA: 1.013 (ref 1.005–1.030)
UUROB: 0.2 mg/dL (ref 0.2–1.0)
pH, UA: 8.5 — ABNORMAL HIGH (ref 5.0–7.5)

## 2016-09-17 LAB — CANNABINOID (GC/MS), URINE
Cannabinoid: POSITIVE — AB
Carboxy THC (GC/MS): 262 ng/mL

## 2016-09-17 LAB — NICOTINE SCREEN, URINE: COTININE UR QL SCN: NEGATIVE ng/mL

## 2016-09-20 NOTE — Progress Notes (Signed)
Would you contact patient with her MaternIT results. Low risk, gender female if desired. Thanks, JML

## 2016-09-21 ENCOUNTER — Telehealth: Payer: Self-pay

## 2016-09-21 NOTE — Telephone Encounter (Signed)
-----   Message from Gunnar BullaJenkins Michelle Lawhorn, CNM sent at 09/20/2016  5:48 PM EST ----- Would you contact patient with her MaternIT results. Low risk, gender female if desired. Thanks, JML

## 2016-09-21 NOTE — Telephone Encounter (Signed)
Called pt no answer. LM for pt informing her of negative genetic results. Advised pt to call back for gender if she desires to find out.

## 2016-09-22 ENCOUNTER — Ambulatory Visit (INDEPENDENT_AMBULATORY_CARE_PROVIDER_SITE_OTHER): Payer: Medicaid Other | Admitting: Obstetrics and Gynecology

## 2016-09-22 VITALS — BP 114/75 | HR 96 | Wt 175.3 lb

## 2016-09-22 DIAGNOSIS — O3432 Maternal care for cervical incompetence, second trimester: Secondary | ICD-10-CM | POA: Insufficient documentation

## 2016-09-22 DIAGNOSIS — I10 Essential (primary) hypertension: Secondary | ICD-10-CM

## 2016-09-22 DIAGNOSIS — O09212 Supervision of pregnancy with history of pre-term labor, second trimester: Secondary | ICD-10-CM

## 2016-09-22 DIAGNOSIS — O09522 Supervision of elderly multigravida, second trimester: Secondary | ICD-10-CM

## 2016-09-22 DIAGNOSIS — O09892 Supervision of other high risk pregnancies, second trimester: Secondary | ICD-10-CM | POA: Insufficient documentation

## 2016-09-22 LAB — POCT URINALYSIS DIPSTICK
Bilirubin, UA: NEGATIVE
Glucose, UA: NEGATIVE
KETONES UA: NEGATIVE
Nitrite, UA: NEGATIVE
PH UA: 7.5
PROTEIN UA: NEGATIVE
RBC UA: NEGATIVE
SPEC GRAV UA: 1.01
UROBILINOGEN UA: NEGATIVE

## 2016-09-22 MED ORDER — HYDROXYPROGESTERONE CAPROATE 250 MG/ML IM OIL
250.0000 mg | TOPICAL_OIL | Freq: Once | INTRAMUSCULAR | Status: AC
  Administered 2016-09-22: 250 mg via INTRAMUSCULAR

## 2016-09-22 NOTE — Progress Notes (Signed)
ROB: Patient denies complaints.  Is s/p cervical cerclage placement last week for cervical incompetency and h/o preterm delivery and 2nd trimester loss. Doing well.  Has initiated 17-OHP injections this week. Received flu vaccine last visit. Normal genetic screen (MaterniT 21). BPs wnl on no meds. RTC in 4 weeks. For anatomy scan at that time.

## 2016-09-22 NOTE — Patient Instructions (Signed)
Second Trimester of Pregnancy The second trimester is from week 13 through week 28, month 4 through 6. This is often the time in pregnancy that you feel your best. Often times, morning sickness has lessened or quit. You may have more energy, and you may get hungry more often. Your unborn baby (fetus) is growing rapidly. At the end of the sixth month, he or she is about 9 inches long and weighs about 1 pounds. You will likely feel the baby move (quickening) between 18 and 20 weeks of pregnancy. Follow these instructions at home:  Avoid all smoking, herbs, and alcohol. Avoid drugs not approved by your doctor.  Do not use any tobacco products, including cigarettes, chewing tobacco, and electronic cigarettes. If you need help quitting, ask your doctor. You may get counseling or other support to help you quit.  Only take medicine as told by your doctor. Some medicines are safe and some are not during pregnancy.  Exercise only as told by your doctor. Stop exercising if you start having cramps.  Eat regular, healthy meals.  Wear a good support bra if your breasts are tender.  Do not use hot tubs, steam rooms, or saunas.  Wear your seat belt when driving.  Avoid raw meat, uncooked cheese, and liter boxes and soil used by cats.  Take your prenatal vitamins.  Take 1500-2000 milligrams of calcium daily starting at the 20th week of pregnancy until you deliver your baby.  Try taking medicine that helps you poop (stool softener) as needed, and if your doctor approves. Eat more fiber by eating fresh fruit, vegetables, and whole grains. Drink enough fluids to keep your pee (urine) clear or pale yellow.  Take warm water baths (sitz baths) to soothe pain or discomfort caused by hemorrhoids. Use hemorrhoid cream if your doctor approves.  If you have puffy, bulging veins (varicose veins), wear support hose. Raise (elevate) your feet for 15 minutes, 3-4 times a day. Limit salt in your diet.  Avoid heavy  lifting, wear low heals, and sit up straight.  Rest with your legs raised if you have leg cramps or low back pain.  Visit your dentist if you have not gone during your pregnancy. Use a soft toothbrush to brush your teeth. Be gentle when you floss.  You can have sex (intercourse) unless your doctor tells you not to.  Go to your doctor visits. Get help if:  You feel dizzy.  You have mild cramps or pressure in your lower belly (abdomen).  You have a nagging pain in your belly area.  You continue to feel sick to your stomach (nauseous), throw up (vomit), or have watery poop (diarrhea).  You have bad smelling fluid coming from your vagina.  You have pain with peeing (urination). Get help right away if:  You have a fever.  You are leaking fluid from your vagina.  You have spotting or bleeding from your vagina.  You have severe belly cramping or pain.  You lose or gain weight rapidly.  You have trouble catching your breath and have chest pain.  You notice sudden or extreme puffiness (swelling) of your face, hands, ankles, feet, or legs.  You have not felt the baby move in over an hour.  You have severe headaches that do not go away with medicine.  You have vision changes. This information is not intended to replace advice given to you by your health care provider. Make sure you discuss any questions you have with your health care   provider. Document Released: 10/19/2009 Document Revised: 12/31/2015 Document Reviewed: 09/25/2012 Elsevier Interactive Patient Education  2017 [employer]  

## 2016-09-27 ENCOUNTER — Ambulatory Visit: Payer: Medicaid Other

## 2016-09-30 ENCOUNTER — Inpatient Hospital Stay
Admission: EM | Admit: 2016-09-30 | Discharge: 2016-09-30 | DRG: 779 | Disposition: A | Discharge status: Home / Self Care | Payer: Medicaid Other | Attending: Obstetrics and Gynecology | Admitting: Obstetrics and Gynecology

## 2016-09-30 ENCOUNTER — Ambulatory Visit: Payer: Medicaid Other

## 2016-09-30 DIAGNOSIS — O328XX0 Maternal care for other malpresentation of fetus, not applicable or unspecified: Secondary | ICD-10-CM | POA: Diagnosis present

## 2016-09-30 DIAGNOSIS — O3432 Maternal care for cervical incompetence, second trimester: Secondary | ICD-10-CM | POA: Diagnosis present

## 2016-09-30 DIAGNOSIS — F1011 Alcohol abuse, in remission: Secondary | ICD-10-CM

## 2016-09-30 DIAGNOSIS — O021 Missed abortion: Secondary | ICD-10-CM | POA: Diagnosis present

## 2016-09-30 DIAGNOSIS — Z3A17 17 weeks gestation of pregnancy: Secondary | ICD-10-CM

## 2016-09-30 DIAGNOSIS — O331 Maternal care for disproportion due to generally contracted pelvis: Secondary | ICD-10-CM | POA: Diagnosis present

## 2016-09-30 LAB — CBC WITH DIFFERENTIAL/PLATELET
Basophils Absolute: 0 10*3/uL (ref 0–0.1)
Basophils Relative: 0 %
Eosinophils Absolute: 0.2 10*3/uL (ref 0–0.7)
Eosinophils Relative: 1 %
HCT: 34.7 % — ABNORMAL LOW (ref 35.0–47.0)
HEMOGLOBIN: 12 g/dL (ref 12.0–16.0)
LYMPHS ABS: 1.9 10*3/uL (ref 1.0–3.6)
LYMPHS PCT: 11 %
MCH: 30.4 pg (ref 26.0–34.0)
MCHC: 34.7 g/dL (ref 32.0–36.0)
MCV: 87.5 fL (ref 80.0–100.0)
MONOS PCT: 4 %
Monocytes Absolute: 0.7 10*3/uL (ref 0.2–0.9)
NEUTROS PCT: 84 %
Neutro Abs: 14 10*3/uL — ABNORMAL HIGH (ref 1.4–6.5)
Platelets: 210 10*3/uL (ref 150–440)
RBC: 3.96 MIL/uL (ref 3.80–5.20)
RDW: 14.3 % (ref 11.5–14.5)
WBC: 16.8 10*3/uL — AB (ref 3.6–11.0)

## 2016-09-30 LAB — BASIC METABOLIC PANEL
Anion gap: 10 (ref 5–15)
BUN: 9 mg/dL (ref 6–20)
CHLORIDE: 105 mmol/L (ref 101–111)
CO2: 24 mmol/L (ref 22–32)
CREATININE: 0.63 mg/dL (ref 0.44–1.00)
Calcium: 8.7 mg/dL — ABNORMAL LOW (ref 8.9–10.3)
GFR calc non Af Amer: >60 mL/min (ref >60)
Glucose, Bld: 118 mg/dL — ABNORMAL HIGH (ref 65–99)
POTASSIUM: 3.3 mmol/L — AB (ref 3.5–5.1)
SODIUM: 139 mmol/L (ref 135–145)

## 2016-09-30 LAB — URINE DRUG SCREEN, QUALITATIVE (ARMC ONLY)
AMPHETAMINES, UR SCREEN: NOT DETECTED
Barbiturates, Ur Screen: NOT DETECTED
Benzodiazepine, Ur Scrn: NOT DETECTED
Cannabinoid 50 Ng, Ur ~~LOC~~: POSITIVE — AB
Cocaine Metabolite,Ur ~~LOC~~: POSITIVE — AB
MDMA (ECSTASY) UR SCREEN: NOT DETECTED
Methadone Scn, Ur: NOT DETECTED
OPIATE, UR SCREEN: POSITIVE — AB
PHENCYCLIDINE (PCP) UR S: NOT DETECTED
Tricyclic, Ur Screen: NOT DETECTED

## 2016-09-30 MED ORDER — IBUPROFEN 600 MG PO TABS
600.0000 mg | ORAL_TABLET | Freq: Four times a day (QID) | ORAL | Status: DC
  Administered 2016-09-30: 600 mg via ORAL
  Filled 2016-09-30: qty 1

## 2016-09-30 MED ORDER — MEPERIDINE HCL 25 MG/ML IJ SOLN
50.0000 mg | Freq: Once | INTRAMUSCULAR | Status: AC
  Administered 2016-09-30: 50 mg via INTRAVENOUS

## 2016-09-30 MED ORDER — COCONUT OIL OIL: 1.0000 "application " | TOPICAL_OIL | Status: DC | PRN

## 2016-09-30 MED ORDER — DIBUCAINE 1 % RE OINT: 1.0000 "application " | TOPICAL_OINTMENT | RECTAL | Status: DC | PRN

## 2016-09-30 MED ORDER — SIMETHICONE 80 MG PO CHEW: 80.0000 mg | CHEWABLE_TABLET | ORAL | Status: DC | PRN

## 2016-09-30 MED ORDER — ONDANSETRON HCL 4 MG/2ML IJ SOLN: 4.0000 mg | INTRAMUSCULAR | Status: DC | PRN

## 2016-09-30 MED ORDER — PRENATAL MULTIVITAMIN CH: 1.0000 | ORAL_TABLET | Freq: Every day | ORAL | Status: DC

## 2016-09-30 MED ORDER — MORPHINE SULFATE (PF) 4 MG/ML IV SOLN
4.0000 mg | Freq: Once | INTRAVENOUS | Status: AC
  Administered 2016-09-30: 4 mg via INTRAVENOUS
  Filled 2016-09-30: qty 1

## 2016-09-30 MED ORDER — ONDANSETRON HCL 4 MG/2ML IJ SOLN
4.0000 mg | Freq: Once | INTRAMUSCULAR | Status: AC
  Administered 2016-09-30: 4 mg via INTRAVENOUS
  Filled 2016-09-30: qty 2

## 2016-09-30 MED ORDER — TETANUS-DIPHTH-ACELL PERTUSSIS 5-2.5-18.5 LF-MCG/0.5 IM SUSP: 0.5000 mL | Freq: Once | INTRAMUSCULAR | Status: DC

## 2016-09-30 MED ORDER — OXYTOCIN 40 UNITS IN LACTATED RINGERS INFUSION - SIMPLE MED
INTRAVENOUS | Status: AC
  Administered 2016-09-30: 40 [IU]
  Filled 2016-09-30: qty 1000

## 2016-09-30 MED ORDER — OXYCODONE-ACETAMINOPHEN 5-325 MG PO TABS: 2.0000 | ORAL_TABLET | ORAL | Status: DC | PRN

## 2016-09-30 MED ORDER — DOCUSATE SODIUM 100 MG PO CAPS: 100.0000 mg | ORAL_CAPSULE | Freq: Two times a day (BID) | ORAL | Status: DC

## 2016-09-30 MED ORDER — LACTATED RINGERS IV BOLUS (SEPSIS)
500.0000 mL | Freq: Once | INTRAVENOUS | Status: AC
  Administered 2016-09-30: 500 mL via INTRAVENOUS

## 2016-09-30 MED ORDER — BENZOCAINE-MENTHOL 20-0.5 % EX AERO: 1.0000 "application " | INHALATION_SPRAY | CUTANEOUS | Status: DC | PRN

## 2016-09-30 MED ORDER — ACETAMINOPHEN 325 MG PO TABS: 650.0000 mg | ORAL_TABLET | ORAL | Status: DC | PRN

## 2016-09-30 MED ORDER — DIPHENHYDRAMINE HCL 25 MG PO CAPS: 25.0000 mg | ORAL_CAPSULE | Freq: Four times a day (QID) | ORAL | Status: DC | PRN

## 2016-09-30 MED ORDER — OXYTOCIN 40 UNITS IN LACTATED RINGERS INFUSION - SIMPLE MED: 2.5000 [IU]/h | INTRAVENOUS | Status: DC | PRN

## 2016-09-30 MED ORDER — ZOLPIDEM TARTRATE 5 MG PO TABS: 5.0000 mg | ORAL_TABLET | Freq: Every evening | ORAL | Status: DC | PRN

## 2016-09-30 MED ORDER — WITCH HAZEL-GLYCERIN EX PADS: 1.0000 "application " | MEDICATED_PAD | CUTANEOUS | Status: DC | PRN

## 2016-09-30 MED ORDER — ONDANSETRON HCL 4 MG PO TABS
4.0000 mg | ORAL_TABLET | ORAL | Status: DC | PRN
Start: 2016-09-30 — End: 2016-09-30

## 2016-09-30 MED ORDER — LACTATED RINGERS IV SOLN: INTRAVENOUS | Status: DC

## 2016-09-30 MED ORDER — MEPERIDINE HCL 25 MG/ML IJ SOLN
INTRAMUSCULAR | Status: AC
  Filled 2016-09-30: qty 2

## 2016-09-30 MED ORDER — OXYCODONE-ACETAMINOPHEN 5-325 MG PO TABS
1.0000 | ORAL_TABLET | ORAL | Status: DC | PRN
  Administered 2016-09-30 (×2): 1 via ORAL
  Filled 2016-09-30 (×2): qty 1

## 2016-09-30 MED ORDER — ONDANSETRON HCL 4 MG/2ML IJ SOLN
INTRAMUSCULAR | Status: AC
  Filled 2016-09-30: qty 2

## 2016-09-30 MED ORDER — MORPHINE SULFATE (PF) 4 MG/ML IV SOLN
INTRAVENOUS | Status: AC
  Filled 2016-09-30: qty 1

## 2016-09-30 NOTE — ED Notes (Signed)
Pt up to toilet in bathroom with small amount of red bleeding. Pt back in bed, family at bedside.

## 2016-09-30 NOTE — Progress Notes (Signed)
This was a fetal loss case. Patient lost her fetal after 18 weeks. Family members present in the room they were grieving. Chaplain prayed with the couple and expressed his condolences to the family.

## 2016-09-30 NOTE — H&P (Signed)
History and Physical   HPI  Joyce Gonzalez is a 41 y.o. [id number] at [redacted]w[redacted]d Estimated Date of Delivery: 03/05/17 who is being admitted  With complaint of pelvic pain contractions leaking fluid and bleeding.  She has a h/o incompetent cervix and had a cerclage placed 2 weeks ago.  She began leaking fluid last night and began having contractions then.  They got worse through the night and she presented to the ED this AM with the above complaints.   OB History  Obstetric History   G4   P2   T0   P2   A1   L2    SAB1   TAB0   Ectopic0   Multiple0   Live Births2     # Outcome Date GA Lbr Len/2nd Weight Sex Delivery Anes PTL Lv  4 Current           3 SAB 04/2004 [redacted]w[redacted]d   F Vag-Spont   ND  2 Preterm 03/16/04 [redacted]w[redacted]d  4 lb 3.2 oz (1.905 kg) F Vag-Spont EPI Y LIV  1 Preterm 09/09/02 [redacted]w[redacted]d  1 lb 2.1 oz (0.513 kg) F Vag-Spont  Y LIV    Obstetric Comments  04/2004 pt called this a stillbirth.    PROBLEM LIST  Pregnancy complications or risks: Patient Active Problem List   Diagnosis Date Noted  . Contracted pelvis during pregnancy 09/30/2016  . History of preterm delivery, currently pregnant in second trimester 09/22/2016  . Cervical incompetence affecting management of pregnancy in second trimester, antepartum 09/22/2016  . Essential hypertension 07/28/2016  . Iron deficiency anemia 07/28/2016  . Osteoarthritis 07/28/2016  . Asthma, well controlled, mild intermittent 07/28/2016  . Seasonal allergies 07/28/2016  . History of alcohol abuse 07/28/2016  . Hypertension     Prenatal labs and studies: ABO, Rh: O/Positive/-- (01/30 1226) Antibody: Negative (01/30 1226) Rubella: 23.70 (01/30 1226) RPR: Non Reactive (01/30 1226)  HBsAg: Negative (01/30 1226)  HIV: Non Reactive (01/30 1226)  GBS:    Prenatal Transfer Tool    Past Medical History:  Diagnosis Date  . Amenorrhea   . Anxiety   . Hypertension   . Migraines      Past Surgical History:  Procedure Laterality Date  .  CERVICAL CERCLAGE N/A 09/12/2016   Procedure: CERCLAGE CERVICAL;  Surgeon: Hildred Laser, MD;  Location: [facility] ORS;  Service: Gynecology;  Laterality: N/A;  . MENISCUS REPAIR Right      Medications    Current Discharge Medication List    CONTINUE these medications which have NOT CHANGED   Details  acetaminophen (TYLENOL) 500 MG tablet Take 500 mg by mouth every 6 (six) hours as needed.    albuterol (PROVENTIL HFA;VENTOLIN HFA) 108 (90 Base) MCG/ACT inhaler Inhale 1-2 puffs into the lungs every 6 (six) hours as needed for wheezing or shortness of breath. Qty: 1 Inhaler, Refills: 0    MAKENA 250 MG/ML OIL injection INJECT INTRAMUSCULARLY ONCE A WEEK Refills: 4    Prenatal Vit-Fe Fumarate-FA (PRENATAL VITAMINS) 28-0.8 MG TABS Take by mouth.         Allergies  Magnesium-containing compounds  Review of Systems  Pertinent items are noted in HPI.  Physical Exam  BP 133/80 (BP Location: Right Arm)   Pulse 95   Temp 97.4 F (36.3 C) (Oral)   Resp (!) 22   Ht 5\' 6"  (1.676 m)   Wt 175 lb (79.4 kg)   LMP 06/07/2016 (Approximate) Comment: unsure of lmp, either Oct. or Sept.  SpO2  99%   BMI 28.25 kg/m   Lungs:  CTA B Cardio: RRR without M/R/G Abd: Soft, gravid, NT Presentation: unknown EXT: No C/C/ 1+ Edema DTRs: 2+ B CERVIX:  Spec exam reveals partially dilated cervix with umbilical cord, legs and feet in the vagina.  No membranes noted. See Prenatal records for more detailed PE.   Test Results  Results for orders placed or performed during the hospital encounter of 09/30/16 (from the past 24 hour(s))  Basic metabolic panel     Status: Abnormal   Collection Time: 09/30/16  8:04 AM  Result Value Ref Range   Sodium 139 135 - 145 mmol/L   Potassium 3.3 (L) 3.5 - 5.1 mmol/L   Chloride 105 101 - 111 mmol/L   CO2 24 22 - 32 mmol/L   Glucose, Bld 118 (H) 65 - 99 mg/dL   BUN 9 6 - 20 mg/dL   Creatinine, Ser 4.090.63 0.44 - 1.00 mg/dL   Calcium 8.7 (L) 8.9 - 10.3 mg/dL    GFR calc non Af Amer >60 >60 mL/min   GFR calc Af Amer >60 >60 mL/min   Anion gap 10 5 - 15  CBC with Differential     Status: Abnormal   Collection Time: 09/30/16  8:04 AM  Result Value Ref Range   WBC 16.8 (H) 3.6 - 11.0 K/uL   RBC 3.96 3.80 - 5.20 MIL/uL   Hemoglobin 12.0 12.0 - 16.0 g/dL   HCT 81.134.7 (L) 91.435.0 - 78.247.0 %   MCV 87.5 80.0 - 100.0 fL   MCH 30.4 26.0 - 34.0 pg   MCHC 34.7 32.0 - 36.0 g/dL   RDW 95.614.3 21.311.5 - 08.614.5 %   Platelets 210 150 - 440 K/uL   Neutrophils Relative % 84 %   Neutro Abs 14.0 (H) 1.4 - 6.5 K/uL   Lymphocytes Relative 11 %   Lymphs Abs 1.9 1.0 - 3.6 K/uL   Monocytes Relative 4 %   Monocytes Absolute 0.7 0.2 - 0.9 K/uL   Eosinophils Relative 1 %   Eosinophils Absolute 0.2 0 - 0.7 K/uL   Basophils Relative 0 %   Basophils Absolute 0.0 0 - 0.1 K/uL     Assessment   [id number] at 469w5d Estimated Date of Delivery: 03/05/17   Patient Active Problem List   Diagnosis Date Noted  . Contracted pelvis during pregnancy 09/30/2016  . History of preterm delivery, currently pregnant in second trimester 09/22/2016  . Cervical incompetence affecting management of pregnancy in second trimester, antepartum 09/22/2016  . Essential hypertension 07/28/2016  . Iron deficiency anemia 07/28/2016  . Osteoarthritis 07/28/2016  . Asthma, well controlled, mild intermittent 07/28/2016  . Seasonal allergies 07/28/2016  . History of alcohol abuse 07/28/2016  . Hypertension     Plan  1. Admit to L&D for expected delivery of non-viable infant.  Will cut cerclage. 2. IV pain management 3.  Discussed situation in detail with patient and family present.  Elonda Huskyavid J. Neiko Trivedi, M.D. 09/30/2016 10:03 AM

## 2016-09-30 NOTE — Discharge Summary (Signed)
Discharge Summary  Date of Admission: 09/30/2016  Date of Discharge:  09/30/2016  Admitting Diagnosis: Premature rupture of membrane at 7060w5d  Secondary Diagnosis: cervical incompetence  Mode of Delivery: vaginal - footling breech     Discharge Diagnosis: s/p PPROM - labor - incompetent cervix - maternal drug abuse     Post partum procedures: cerclage removal                       Discharge Day SOAP Note:  Progress Note - Vaginal Delivery  Joyce Gonzalez is a 41 y.o. [id number] now PP day 0 s/p Vaginal, Breech .   Subjective  The patient has the following complaints: has no unusual complaints.  She is requesting discharge today. Pain is controlled with current medications.   Patient is urinating without difficulty.  She is ambulating well.    Objective  Vital signs: BP 118/68 (BP Location: Right Arm)   Pulse 91   Temp 97.7 F (36.5 C) (Oral)   Resp 16   Ht 5\' 6"  (1.676 m)   Wt 175 lb (79.4 kg)   LMP 06/07/2016 (Approximate) Comment: unsure of lmp, either Oct. or Sept.  SpO2 99%   BMI 28.25 kg/m   Physical Exam: Gen: NAD Fundus    Lochia    Perineum       Data Review Labs: CBC Latest Ref Rng & Units 09/30/2016 09/12/2016 09/06/2016  WBC 3.6 - 11.0 K/uL 16.8(H) 13.6(H) 9.1  Hemoglobin 12.0 - 16.0 g/dL 45.412.0 11.8(L) -  Hematocrit 35.0 - 47.0 % 34.7(L) 34.8(L) 37.6  Platelets 150 - 440 K/uL 210 268 235    Assessment/Plan  Active Problems:   Contracted pelvis during pregnancy   Preterm delivery    Plan for discharge today.  Discharge Instructions: Per After Visit Summary. Activity: Advance as tolerated. Pelvic rest for 6 weeks.  Also refer to After Visit Summary Diet: Regular Medications: Allergies as of 09/30/2016      Reactions   Magnesium-containing Compounds    Pt had reaction when given this with pregnancy      Medication List    STOP taking these medications   acetaminophen 500 MG tablet Commonly known as:   TYLENOL   MAKENA 250 mg/mL Oil injection Generic drug:  hydroxyprogesterone caproate     TAKE these medications   albuterol 108 (90 Base) MCG/ACT inhaler Commonly known as:  PROVENTIL HFA;VENTOLIN HFA Inhale 1-2 puffs into the lungs every 6 (six) hours as needed for wheezing or shortness of breath.   Prenatal Vitamins 28-0.8 MG Tabs Take by mouth.      Outpatient follow up: 5 days  Discharged Condition: stable  Discharged to: home  Newborn Data: Disposition:disposition up to parents regarding 17 week fetus.    Elonda Huskyavid J. Evans, M.D. 09/30/2016 5:20 PM

## 2016-09-30 NOTE — ED Provider Notes (Addendum)
[facility] Emergency Department Provider Note  ____________________________________________  Time seen: Approximately 8:11 AM  I have reviewed the triage vital signs and the nursing notes.   HISTORY  Chief Complaint Contractions    HPI Joyce Gonzalez is a 41 y.o. female who complains of painful contractions that started this morning, occurring every 3-5 minutes. She also noted a lot of clear fluid leakage yesterday. She reports she is 17 weeks 5 days pregnant, [id number] with a history of cervical incompetence status post cerclage and a history of second trimester loss and preterm births.  Denies chest pain shortness of breath fevers or chills. No bleeding. Has not yet noticed fetal movement in this pregnancy. Followed by [facility].     Past Medical History:  Diagnosis Date  . Amenorrhea   . Anxiety   . Hypertension   . Migraines      Patient Active Problem List   Diagnosis Date Noted  . History of preterm delivery, currently pregnant in second trimester 09/22/2016  . Cervical incompetence affecting management of pregnancy in second trimester, antepartum 09/22/2016  . Essential hypertension 07/28/2016  . Iron deficiency anemia 07/28/2016  . Osteoarthritis 07/28/2016  . Asthma, well controlled, mild intermittent 07/28/2016  . Seasonal allergies 07/28/2016  . History of alcohol abuse 07/28/2016  . Hypertension      Past Surgical History:  Procedure Laterality Date  . CERVICAL CERCLAGE N/A 09/12/2016   Procedure: CERCLAGE CERVICAL;  Surgeon: Hildred Laser, MD;  Location: [facility] ORS;  Service: Gynecology;  Laterality: N/A;  . MENISCUS REPAIR Right      Prior to Admission medications   Medication Sig Start Date End Date Taking? Authorizing Provider  acetaminophen (TYLENOL) 500 MG tablet Take 500 mg by mouth every 6 (six) hours as needed.   Yes Historical Provider, MD  albuterol (PROVENTIL HFA;VENTOLIN HFA) 108 (90 Base) MCG/ACT  inhaler Inhale 1-2 puffs into the lungs every 6 (six) hours as needed for wheezing or shortness of breath. 10/22/15  Yes William P Roemer, PA-C  MAKENA 250 MG/ML OIL injection INJECT INTRAMUSCULARLY ONCE A WEEK 09/12/16  Yes Historical Provider, MD  Prenatal Vit-Fe Fumarate-FA (PRENATAL VITAMINS) 28-0.8 MG TABS Take by mouth.   Yes Historical Provider, MD     Allergies Magnesium-containing compounds   Family History  Problem Relation Age of Onset  . Osteoarthritis Mother   . Asthma Mother   . Diabetes Mother   . Hyperlipidemia Mother   . Migraines Mother   . Diabetes Father   . Osteoarthritis Maternal Aunt   . Rheum arthritis Maternal Uncle   . Heart failure Maternal Uncle   . Hypertension Maternal Uncle   . Diabetes Paternal Uncle   . Asthma Maternal Grandmother   . Heart failure Maternal Grandmother   . Diabetes Paternal Grandmother   . Rashes / Skin problems Daughter     eczema  . Alzheimer's disease Other     family history    Social History Social History  Substance Use Topics  . Smoking status: Former Smoker    Packs/day: 0.50    Types: Cigarettes  . Smokeless tobacco: Never Used  . Alcohol use Yes     Comment: not now, drank heavy before she found out    Review of Systems  Constitutional:   No fever or chills.  ENT:   No sore throat. No rhinorrhea. Cardiovascular:   No chest pain. Respiratory:   No dyspnea or cough. Gastrointestinal:   Painful pelvic contractions.  Genitourinary:   Negative for dysuria or difficulty urinating. Musculoskeletal:   Negative for focal pain or swelling Neurological:   Negative for headaches 10-point ROS otherwise negative.  ____________________________________________   PHYSICAL EXAM:  VITAL SIGNS: ED Triage Vitals  Enc Vitals Group     BP 09/30/16 0801 (!) 127/101     Pulse Rate 09/30/16 0801 96     Resp --      Temp 09/30/16 0801 99 F (37.2 C)     Temp Source 09/30/16 0801 Oral     SpO2 09/30/16 0801 99 %      Weight 09/30/16 0802 175 lb (79.4 kg)     Height 09/30/16 0802 5\' 6"  (1.676 m)     Head Circumference --      Peak Flow --      Pain Score 09/30/16 0802 10     Pain Loc --      Pain Edu? --      Excl. in GC? --     Vital signs reviewed, nursing assessments reviewed.   Constitutional:   Alert and oriented. Uncomfortable, no distress. Eyes:   No scleral icterus. No conjunctival pallor. PERRL. EOMI.  No nystagmus. ENT   Head:   Normocephalic and atraumatic.   Nose:   No congestion/rhinnorhea. No septal hematoma   Mouth/Throat:   MMM, no pharyngeal erythema. No peritonsillar mass.    Neck:   No stridor. No SubQ emphysema. No meningismus. Hematological/Lymphatic/Immunilogical:   No cervical lymphadenopathy. Cardiovascular:   RRR. Symmetric bilateral radial and DP pulses.  No murmurs.  Respiratory:   Normal respiratory effort without tachypnea nor retractions. Breath sounds are clear and equal bilaterally. No wheezes/rales/rhonchi. Gastrointestinal:   Soft, nontender gravid abdomen. Non distended. There is no CVA tenderness.  No rebound, rigidity, or guarding. Genitourinary:   Performed with nurse at bedside. Cervical cerclage in place. Bulging at the cervix. Musculoskeletal:   Normal range of motion in all extremities. No joint effusions.  No lower extremity tenderness.  No edema. Neurologic:   Normal speech and language.  CN 2-10 normal. Motor grossly intact. No gross focal neurologic deficits are appreciated.  Skin:    Skin is warm, dry and intact. No rash noted.  No petechiae, purpura, or bullae.  ____________________________________________    LABS (pertinent positives/negatives) (all labs ordered are listed, but only abnormal results are displayed) Labs Reviewed - No data to display ____________________________________________   EKG    ____________________________________________    RADIOLOGY  No results  found.  ____________________________________________   PROCEDURES Procedures  ____________________________________________   INITIAL IMPRESSION / ASSESSMENT AND PLAN / ED COURSE  Pertinent labs & imaging results that were available during my care of the patient were reviewed by me and considered in my medical decision making (see chart for details).  Patient presents in preterm labor, unfortunately there is a high likelihood of miscarriage at this time. I discussed with on call Dr. of [facility] who will admit to labor and delivery for monitoring and management. He advises against tocolytics as the fetus is currently far from viability and there is nothing to be done to stave off labor until viability. Similarly, since the fetus currently nonviable, I will treat the patient's symptoms with morphine and Zofran as the risks to the fetus is none at this point         ____________________________________________   FINAL CLINICAL IMPRESSION(S) / ED DIAGNOSES  Final diagnoses:  Preterm labor in second trimester without delivery  New Prescriptions   No medications on file     Portions of this note were generated with dragon dictation software. Dictation errors may occur despite best attempts at proofreading.    Sharman Cheek, MD 09/30/16 8119    Sharman Cheek, MD 09/30/16 [phone]

## 2016-09-30 NOTE — ED Triage Notes (Signed)
This RN called upstairs to make unit aware of pt status. Pt c/o contractions and spotting today, 17 weeks, hx of preterm labor and had cervix sewn couple of weeks ago.  Labor and delivery states pt needs to be seen down in ER first and consult OB on call.  Pt noted to be very uncomfortable out in triage.

## 2016-09-30 NOTE — ED Notes (Signed)
Pt having difficulty sitting in wheelchair due to contraction and pain, states that the contractions started last night and have gotten progressively worse, pt states that she has not called Dr Valentino Saxonherry to inform her regarding her contractions. Pt states that she feels like she going to vomit and states that she is sweating. Unable to obtain bp at this time due to movement of patient. Pt also states that she thinks she may have gout in her toe due to pain she is having in her left foot

## 2016-09-30 NOTE — OB Triage Note (Signed)
Pt presents c/o ctx since last night. States that they are frequent but unsure how far apart they are. Pt states she has had nausea and vomiting. Pt states she has had some bleeding. Patient has not felt baby move today

## 2016-10-03 LAB — SURGICAL PATHOLOGY

## 2016-10-04 ENCOUNTER — Ambulatory Visit: Payer: Medicaid Other

## 2016-10-05 ENCOUNTER — Ambulatory Visit
Admission: EM | Admit: 2016-10-05 | Discharge: 2016-10-05 | Disposition: A | Discharge status: Home / Self Care | Payer: Medicaid Other | Attending: Family Medicine | Admitting: Family Medicine

## 2016-10-05 ENCOUNTER — Ambulatory Visit: Payer: Medicaid Other

## 2016-10-05 ENCOUNTER — Encounter: Payer: Self-pay | Admitting: Obstetrics and Gynecology

## 2016-10-05 DIAGNOSIS — M79673 Pain in unspecified foot: Secondary | ICD-10-CM | POA: Insufficient documentation

## 2016-10-05 DIAGNOSIS — M79675 Pain in left toe(s): Secondary | ICD-10-CM | POA: Diagnosis not present

## 2016-10-05 MED ORDER — PREDNISONE 20 MG PO TABS: 20.0000 mg | ORAL_TABLET | Freq: Every day | ORAL | 0 refills | Status: DC

## 2016-10-05 MED ORDER — HYDROCODONE-ACETAMINOPHEN 5-325 MG PO TABS: ORAL_TABLET | ORAL | 0 refills | Status: DC

## 2016-10-05 NOTE — ED Triage Notes (Addendum)
Pt c/o left foot pain for the last 6 days , she has had some problems with gout in the past, however this is different and she says her foot is swelling and the pain is ridiculous. The pain started in the great toe and has spread to the rest of the foot.  She changed eating habits to see if it would help. She does mention dropping a plate on her foot about a week ago though.

## 2016-10-05 NOTE — ED Provider Notes (Signed)
[facility]    CSN: [id number] Arrival date & time: 10/05/16  1103     History   Chief Complaint Chief Complaint  Patient presents with  . Foot Pain    Left    HPI Joyce Gonzalez is a 41 y.o. female.   The history is provided by the patient.  Foot Pain  This is a new problem. The current episode started more than 2 days ago. The problem occurs constantly. The problem has not changed since onset.Pertinent negatives include no chest pain, no abdominal pain, no headaches and no shortness of breath. Associated symptoms comments: Redness and mild swelling over the big toe joint. The symptoms are aggravated by walking. Nothing relieves the symptoms.    Past Medical History:  Diagnosis Date  . Amenorrhea   . Anxiety   . Hypertension   . Migraines     Patient Active Problem List   Diagnosis Date Noted  . Contracted pelvis during pregnancy 09/30/2016  . Preterm delivery 09/30/2016  . History of preterm delivery, currently pregnant in second trimester 09/22/2016  . Cervical incompetence affecting management of pregnancy in second trimester, antepartum 09/22/2016  . Essential hypertension 07/28/2016  . Iron deficiency anemia 07/28/2016  . Osteoarthritis 07/28/2016  . Asthma, well controlled, mild intermittent 07/28/2016  . Seasonal allergies 07/28/2016  . History of alcohol abuse 07/28/2016  . Hypertension     Past Surgical History:  Procedure Laterality Date  . CERVICAL CERCLAGE N/A 09/12/2016   Procedure: CERCLAGE CERVICAL;  Surgeon: Hildred LaserAnika Cherry, MD;  Location: [facility] ORS;  Service: Gynecology;  Laterality: N/A;  . MENISCUS REPAIR Right     OB History    Gravida Para Term Preterm AB Living   4 3 0 2 1 2    SAB TAB Ectopic Multiple Live Births   1 0 0 0 2      Obstetric Comments   04/2004 pt called this a stillbirth.       Home Medications    Prior to Admission medications   Medication Sig Start Date End Date Taking? Authorizing Provider    albuterol (PROVENTIL HFA;VENTOLIN HFA) 108 (90 Base) MCG/ACT inhaler Inhale 1-2 puffs into the lungs every 6 (six) hours as needed for wheezing or shortness of breath. 10/22/15   Lutricia FeilWilliam P Roemer, PA-C  HYDROcodone-acetaminophen (NORCO/VICODIN) 5-325 MG tablet 1-2 tabs po bid prn 10/05/16   Payton Mccallumrlando Keshav Winegar, MD  predniSONE (DELTASONE) 20 MG tablet Take 1 tablet (20 mg total) by mouth daily. 10/05/16   Payton Mccallumrlando Aerial Dilley, MD    Family History Family History  Problem Relation Age of Onset  . Osteoarthritis Mother   . Asthma Mother   . Diabetes Mother   . Hyperlipidemia Mother   . Migraines Mother   . Diabetes Father   . Osteoarthritis Maternal Aunt   . Rheum arthritis Maternal Uncle   . Heart failure Maternal Uncle   . Hypertension Maternal Uncle   . Diabetes Paternal Uncle   . Asthma Maternal Grandmother   . Heart failure Maternal Grandmother   . Diabetes Paternal Grandmother   . Rashes / Skin problems Daughter     eczema  . Alzheimer's disease Other     family history    Social History Social History  Substance Use Topics  . Smoking status: Former Smoker    Packs/day: 0.50    Types: Cigarettes  . Smokeless tobacco: Never Used  . Alcohol use No     Comment: not now, drank heavy before she found  out     Allergies   Magnesium-containing compounds   Review of Systems Review of Systems  Respiratory: Negative for shortness of breath.   Cardiovascular: Negative for chest pain.  Gastrointestinal: Negative for abdominal pain.  Neurological: Negative for headaches.     Physical Exam Triage Vital Signs ED Triage Vitals  Enc Vitals Group     BP 10/05/16 1228 122/76     Pulse Rate 10/05/16 1228 76     Resp 10/05/16 1228 18     Temp 10/05/16 1228 97.8 F (36.6 C)     Temp Source 10/05/16 1228 Oral     SpO2 10/05/16 1228 100 %     Weight 10/05/16 1229 175 lb (79.4 kg)     Height 10/05/16 1229 5\' 6"  (1.676 m)     Head Circumference --      Peak Flow --      Pain Score  10/05/16 1231 6     Pain Loc --      Pain Edu? --      Excl. in GC? --    No data found.   Updated Vital Signs BP 122/76 (BP Location: Left Arm)   Pulse 76   Temp 97.8 F (36.6 C) (Oral)   Resp 18   Ht 5\' 6"  (1.676 m)   Wt 175 lb (79.4 kg)   LMP 06/07/2016 (Approximate) Comment: unsure of lmp, either Oct. or Sept.  SpO2 100%   BMI 28.25 kg/m   Visual Acuity Right Eye Distance:   Left Eye Distance:   Bilateral Distance:    Right Eye Near:   Left Eye Near:    Bilateral Near:     Physical Exam  Constitutional: She appears well-developed and well-nourished. No distress.  Musculoskeletal:       Left foot: There is bony tenderness (over the  1st MTP joint) and swelling (over the 1st MTP joint; mild). There is normal range of motion, no tenderness, normal capillary refill, no crepitus, no deformity and no laceration.  Skin: She is not diaphoretic.  Nursing note and vitals reviewed.    UC Treatments / Results  Labs (all labs ordered are listed, but only abnormal results are displayed) Labs Reviewed - No data to display  EKG  EKG Interpretation None       Radiology Dg Foot Complete Left  Result Date: 10/05/2016 CLINICAL DATA:  Pain for 1 week following dropping plates on toe 2 weeks ago EXAM: LEFT FOOT - COMPLETE 3+ VIEW COMPARISON:  None. FINDINGS: There is no evidence of fracture or dislocation. There is no evidence of arthropathy or other focal bone abnormality. Soft tissues are unremarkable. IMPRESSION: No acute abnormality noted. Electronically Signed   By: Alcide Clever M.D.   On: 10/05/2016 13:52    Procedures Procedures (including critical care time)  Medications Ordered in UC Medications - No data to display   Initial Impression / Assessment and Plan / UC Course  I have reviewed the triage vital signs and the nursing notes.  Pertinent labs & imaging results that were available during my care of the patient were reviewed by me and considered in my  medical decision making (see chart for details).       Final Clinical Impressions(s) / UC Diagnoses   Final diagnoses:  Great toe pain, left  (likely secondary to gout)  New Prescriptions Discharge Medication List as of 10/05/2016  2:02 PM    START taking these medications   Details  HYDROcodone-acetaminophen (NORCO/VICODIN) 5-325  MG tablet 1-2 tabs po bid prn, Print    predniSONE (DELTASONE) 20 MG tablet Take 1 tablet (20 mg total) by mouth daily., Starting Wed 10/05/2016, Normal       1. x-ray results (negative) and diagnosis reviewed with patient 2. rx as per orders above; reviewed possible side effects, interactions, risks and benefits  3. Recommend supportive treatment with elevation 4. Follow-up prn if symptoms worsen or don't improve   Payton Mccallum, MD 10/05/16 1454

## 2016-10-07 ENCOUNTER — Ambulatory Visit: Payer: Medicaid Other

## 2016-10-11 ENCOUNTER — Ambulatory Visit: Payer: Medicaid Other

## 2016-10-14 ENCOUNTER — Ambulatory Visit: Payer: Medicaid Other

## 2016-10-20 ENCOUNTER — Other Ambulatory Visit: Payer: Medicaid Other

## 2016-10-20 ENCOUNTER — Encounter: Payer: Medicaid Other | Admitting: Obstetrics and Gynecology

## 2017-02-21 ENCOUNTER — Encounter: Payer: Self-pay | Admitting: *Deleted

## 2017-02-21 ENCOUNTER — Ambulatory Visit
Admission: EM | Admit: 2017-02-21 | Discharge: 2017-02-21 | Disposition: A | Discharge status: Home / Self Care | Payer: Medicaid Other | Attending: Family Medicine | Admitting: Family Medicine

## 2017-02-21 DIAGNOSIS — M26603 Bilateral temporomandibular joint disorder, unspecified: Secondary | ICD-10-CM | POA: Diagnosis not present

## 2017-02-21 DIAGNOSIS — H6692 Otitis media, unspecified, left ear: Secondary | ICD-10-CM

## 2017-02-21 DIAGNOSIS — M26623 Arthralgia of bilateral temporomandibular joint: Secondary | ICD-10-CM

## 2017-02-21 MED ORDER — AMOXICILLIN-POT CLAVULANATE 875-125 MG PO TABS: 1.0000 | ORAL_TABLET | Freq: Two times a day (BID) | ORAL | 0 refills | Status: DC

## 2017-02-21 MED ORDER — CYCLOBENZAPRINE HCL 10 MG PO TABS: 10.0000 mg | ORAL_TABLET | Freq: Every evening | ORAL | 0 refills | Status: DC | PRN

## 2017-02-21 MED ORDER — MELOXICAM 15 MG PO TABS: 15.0000 mg | ORAL_TABLET | Freq: Every day | ORAL | 0 refills | Status: DC | PRN

## 2017-02-21 NOTE — ED Triage Notes (Signed)
Was seen by PCP last Weds and dx with ear infections. Here today with bilat ear pain, and facial pain, headache, N/V, and intermittent fever.

## 2017-02-21 NOTE — ED Provider Notes (Addendum)
[facility] ____________________________________________  Time seen: Approximately 1:45 PM  I have reviewed the triage vital signs and the nursing notes.   HISTORY  Chief Complaint Otalgia; Facial Pain; Headache; Nausea; Emesis; and Fever   HPI Joyce Gonzalez is a 41 y.o. female present for evaluation of bilateral ear discomfort, left greater than right for the last 1.5 weeks. Also reports some surrounding facial pain around bilateral ears. Reports was nauseous this morning, no vomiting and no longer nauseated. Denies fevers. Reports continues to eat and drink well. Patient reports about one week ago she was seen by her primary care physician for the same complaints and was noted to have a left ear infection was started on oral cephalexin. Patient reports she finishes this prescription today, but continues with pain prompted her to be evaluated. Denies any ear drainage, bloody drainage or trauma. Denies recent swimming. Denies fevers, vision changes, redness or rash. Reports continues to eat and drink well. Denies sore throat, nasal congestion. States had a little bit of a cough this morning, none now. Reports has been taking the cephalexin and occasional over-the-counter ibuprofen with some improvement, resolution. Reports slight muffled hearing sensation to left ear, denies hearing loss. Denies tinnitus. Denies chronic ear problems. Denies attempting abdominal pain. Reports continues with normal urination and bowel movements. Reports otherwise feels well. Patient does report that she grinds her teeth at night, does not wear a mouthguard.  Denies chest pain, shortness of breath, abdominal pain, dysuria, or rash. Denies recent sickness. Denies recent antibiotic use. [place]: PCP Patient's last menstrual period was 02/21/2017 (exact date). Denies pregnancy    Past Medical History:  Diagnosis Date  . Amenorrhea   . Anxiety   . Hypertension   . Migraines      Patient Active Problem List   Diagnosis Date Noted  . Contracted pelvis during pregnancy 09/30/2016  . Preterm delivery 09/30/2016  . History of preterm delivery, currently pregnant in second trimester 09/22/2016  . Cervical incompetence affecting management of pregnancy in second trimester, antepartum 09/22/2016  . Essential hypertension 07/28/2016  . Iron deficiency anemia 07/28/2016  . Osteoarthritis 07/28/2016  . Asthma, well controlled, mild intermittent 07/28/2016  . Seasonal allergies 07/28/2016  . History of alcohol abuse 07/28/2016  . Hypertension     Past Surgical History:  Procedure Laterality Date  . CERVICAL CERCLAGE N/A 09/12/2016   Procedure: CERCLAGE CERVICAL;  Surgeon: Hildred LaserAnika Cherry, MD;  Location: [facility] ORS;  Service: Gynecology;  Laterality: N/A;  . MENISCUS REPAIR Right      No current facility-administered medications for this encounter.   Current Outpatient Prescriptions:  .  albuterol (PROVENTIL HFA;VENTOLIN HFA) 108 (90 Base) MCG/ACT inhaler, Inhale 1-2 puffs into the lungs every 6 (six) hours as needed for wheezing or shortness of breath., Disp: 1 Inhaler, Rfl: 0 .  amoxicillin-clavulanate (AUGMENTIN) 875-125 MG tablet, Take 1 tablet by mouth every 12 (twelve) hours., Disp: 20 tablet, Rfl: 0 .  cyclobenzaprine (FLEXERIL) 10 MG tablet, Take 1 tablet (10 mg total) by mouth at bedtime as needed (pain). Do not drive while taking as can cause drowsiness, Disp: 10 tablet, Rfl: 0 .  HYDROcodone-acetaminophen (NORCO/VICODIN) 5-325 MG tablet, 1-2 tabs po bid prn, Disp: 6 tablet, Rfl: 0 .  meloxicam (MOBIC) 15 MG tablet, Take 1 tablet (15 mg total) by mouth daily as needed., Disp: 10 tablet, Rfl: 0 .  predniSONE (DELTASONE) 20 MG tablet, Take 1 tablet (20 mg total) by mouth daily., Disp: 7 tablet, Rfl:  0  Allergies Magnesium-containing compounds  Family History  Problem Relation Age of Onset  . Osteoarthritis Mother   . Asthma Mother   . Diabetes Mother   .  Hyperlipidemia Mother   . Migraines Mother   . Diabetes Father   . Osteoarthritis Maternal Aunt   . Rheum arthritis Maternal Uncle   . Heart failure Maternal Uncle   . Hypertension Maternal Uncle   . Diabetes Paternal Uncle   . Asthma Maternal Grandmother   . Heart failure Maternal Grandmother   . Diabetes Paternal Grandmother   . Rashes / Skin problems Daughter        eczema  . Alzheimer's disease Other        family history    Social History Social History  Substance Use Topics  . Smoking status: Former Smoker    Packs/day: 0.50    Types: Cigarettes  . Smokeless tobacco: Never Used  . Alcohol use No     Comment: not now, drank heavy before she found out    Review of Systems Constitutional: No fever/chills Eyes: No visual changes. ENT: No sore throat. Cardiovascular: Denies chest pain. Respiratory: Denies shortness of breath. Gastrointestinal: No abdominal pain.  No nausea, no vomiting.  No diarrhea.  No constipation. Genitourinary: Negative for dysuria. Musculoskeletal: Negative for back pain. Skin: Negative for rash.  ____________________________________________   PHYSICAL EXAM:  VITAL SIGNS: ED Triage Vitals  Enc Vitals Group     BP 02/21/17 1246 (!) 131/91     Pulse Rate 02/21/17 1246 71     Resp 02/21/17 1246 16     Temp 02/21/17 1246 98.2 F (36.8 C)     Temp Source 02/21/17 1246 Oral     SpO2 02/21/17 1246 100 %     Weight 02/21/17 1247 160 lb (72.6 kg)     Height 02/21/17 1247 5\' 5"  (1.651 m)     Head Circumference --      Peak Flow --      Pain Score 02/21/17 1248 6     Pain Loc --      Pain Edu? --      Excl. in GC? --    Constitutional: Alert and oriented. Well appearing and in no acute distress. Eyes: Conjunctivae are normal.  Head: Atraumatic. No sinus tenderness to palpation. No swelling. No erythema. Bilateral TMJ tenderness, left greater than right.  Ears: Right: Nontender, no erythema, normal TM. Left: Nontender, normal canal,  moderate erythema and dull TM, no drainage, TM intact. No mastoid tenderness bilaterally.  Nose: No nasal congestion or rhinorrhea.  Mouth/Throat: Mucous membranes are moist. No pharyngeal erythema. No tonsillar swelling or exudate.  Neck: No stridor.  No cervical spine tenderness to palpation. Hematological/Lymphatic/Immunilogical: No cervical lymphadenopathy. Cardiovascular: Normal rate, regular rhythm. Grossly normal heart sounds.  Good peripheral circulation. Respiratory: Normal respiratory effort.  No retractions. No wheezes, rales or rhonchi. Good air movement.  Gastrointestinal: Soft and nontender. Normal Bowel sounds. No CVA tenderness. Musculoskeletal: Ambulatory with steady gait. No cervical, thoracic or lumbar tenderness to palpation. Neurologic:  Normal speech and language. No gait instability. Skin:  Skin appears warm, dry and intact. No rash noted. Psychiatric: Mood and affect are normal. Speech and behavior are normal.  ___________________________________________   LABS (all labs ordered are listed, but only abnormal results are displayed)  Labs Reviewed - No data to display  PROCEDURES Procedures    INITIAL IMPRESSION / ASSESSMENT AND PLAN / ED COURSE  Pertinent labs & imaging results that  were available during my care of the patient were reviewed by me and considered in my medical decision making (see chart for details).  Well-appearing patient. No acute distress. Recent diagnosis with left ear infection, left ear still with mild otitis. Patient finishing up cephalexin. Will treat patient with oral Augmentin. Nausea resolved, denies abdominal complaints now.Patient also with long-standing history of grinding her teeth. Patient also TMJ tenderness bilaterally left greater than right. Will treat patient with oral daily Mobic and when necessary Flexeril. Encouraged mouth guard. Encourage a soft diet, rest, fluids and supportive care. Work note given for today. Encourage PCP  or ENT follow-up as needed. Discussed indication, risks and benefits of medications with patient. Also encouraged patient to take medications with food to avoid nausea. Discussed use of probiotics and yogurt, patient states she does sometimes get yeast infections with oral antibiotics, still has one Diflucan tablet at home given by her primary doctor if needed, which she has not used yet as no vaginitis symptoms.  Discussed follow up with Primary care physician this week. Discussed follow up and return parameters including no resolution or any worsening concerns. Patient verbalized understanding and agreed to plan.   ____________________________________________   FINAL CLINICAL IMPRESSION(S) / ED DIAGNOSES  Final diagnoses:  Left otitis media, unspecified otitis media type  Bilateral temporomandibular joint pain     Discharge Medication List as of 02/21/2017  1:47 PM    START taking these medications   Details  amoxicillin-clavulanate (AUGMENTIN) 875-125 MG tablet Take 1 tablet by mouth every 12 (twelve) hours., Starting Tue 02/21/2017, Normal    cyclobenzaprine (FLEXERIL) 10 MG tablet Take 1 tablet (10 mg total) by mouth at bedtime as needed (pain). Do not drive while taking as can cause drowsiness, Starting Tue 02/21/2017, Normal    meloxicam (MOBIC) 15 MG tablet Take 1 tablet (15 mg total) by mouth daily as needed., Starting Tue 02/21/2017, Normal        Note: This dictation was prepared with Dragon dictation along with smaller phrase technology. Any transcriptional errors that result from this process are unintentional.         Renford Dills, NP 02/21/17 1603

## 2017-02-21 NOTE — Discharge Instructions (Signed)
Take medication as prescribed. Rest. Drink plenty of fluids.  ° °Follow up with your primary care physician this week as needed. Return to Urgent care for new or worsening concerns.  ° °

## 2017-04-12 ENCOUNTER — Ambulatory Visit: Payer: Medicaid Other | Admitting: Physical Therapy

## 2017-11-19 ENCOUNTER — Encounter: Payer: Self-pay | Admitting: Emergency Medicine

## 2017-11-19 ENCOUNTER — Emergency Department
Admission: EM | Admit: 2017-11-19 | Discharge: 2017-11-19 | Disposition: A | Discharge status: Home / Self Care | Payer: Medicaid Other | Attending: Emergency Medicine | Admitting: Emergency Medicine

## 2017-11-19 DIAGNOSIS — Z87891 Personal history of nicotine dependence: Secondary | ICD-10-CM | POA: Insufficient documentation

## 2017-11-19 DIAGNOSIS — Z3202 Encounter for pregnancy test, result negative: Secondary | ICD-10-CM | POA: Insufficient documentation

## 2017-11-19 DIAGNOSIS — L03211 Cellulitis of face: Secondary | ICD-10-CM | POA: Diagnosis not present

## 2017-11-19 DIAGNOSIS — I10 Essential (primary) hypertension: Secondary | ICD-10-CM | POA: Insufficient documentation

## 2017-11-19 DIAGNOSIS — J45909 Unspecified asthma, uncomplicated: Secondary | ICD-10-CM | POA: Insufficient documentation

## 2017-11-19 DIAGNOSIS — R220 Localized swelling, mass and lump, head: Secondary | ICD-10-CM | POA: Diagnosis present

## 2017-11-19 DIAGNOSIS — Z79899 Other long term (current) drug therapy: Secondary | ICD-10-CM | POA: Insufficient documentation

## 2017-11-19 LAB — CBC WITH DIFFERENTIAL/PLATELET
BASOS PCT: 1 %
Basophils Absolute: 0.1 10*3/uL (ref 0–0.1)
EOS ABS: 0.3 10*3/uL (ref 0–0.7)
Eosinophils Relative: 3 %
HCT: 36.4 % (ref 35.0–47.0)
Hemoglobin: 12.3 g/dL (ref 12.0–16.0)
LYMPHS ABS: 2 10*3/uL (ref 1.0–3.6)
Lymphocytes Relative: 24 %
MCH: 31 pg (ref 26.0–34.0)
MCHC: 33.8 g/dL (ref 32.0–36.0)
MCV: 91.5 fL (ref 80.0–100.0)
MONOS PCT: 7 %
Monocytes Absolute: 0.6 10*3/uL (ref 0.2–0.9)
Neutro Abs: 5.3 10*3/uL (ref 1.4–6.5)
Neutrophils Relative %: 65 %
Platelets: 194 10*3/uL (ref 150–440)
RBC: 3.98 MIL/uL (ref 3.80–5.20)
RDW: 16.2 % — AB (ref 11.5–14.5)
WBC: 8.1 10*3/uL (ref 3.6–11.0)

## 2017-11-19 LAB — COMPREHENSIVE METABOLIC PANEL
ALBUMIN: 3.4 g/dL — AB (ref 3.5–5.0)
ALK PHOS: 36 U/L — AB (ref 38–126)
ALT: 25 U/L (ref 14–54)
AST: 35 U/L (ref 15–41)
Anion gap: 8 (ref 5–15)
BUN: 12 mg/dL (ref 6–20)
CALCIUM: 8.3 mg/dL — AB (ref 8.9–10.3)
CO2: 24 mmol/L (ref 22–32)
CREATININE: 0.66 mg/dL (ref 0.44–1.00)
Chloride: 105 mmol/L (ref 101–111)
GFR calc Af Amer: >60 mL/min (ref >60)
GFR calc non Af Amer: >60 mL/min (ref >60)
GLUCOSE: 126 mg/dL — AB (ref 65–99)
Potassium: 3.6 mmol/L (ref 3.5–5.1)
SODIUM: 137 mmol/L (ref 135–145)
Total Bilirubin: 0.2 mg/dL — ABNORMAL LOW (ref 0.3–1.2)
Total Protein: 5.5 g/dL — ABNORMAL LOW (ref 6.5–8.1)

## 2017-11-19 LAB — POCT PREGNANCY, URINE: PREG TEST UR: NEGATIVE

## 2017-11-19 MED ORDER — HYDROCODONE-ACETAMINOPHEN 5-325 MG PO TABS
1.0000 | ORAL_TABLET | Freq: Once | ORAL | Status: AC
  Administered 2017-11-19: 1 via ORAL
  Filled 2017-11-19: qty 1

## 2017-11-19 MED ORDER — HYDROCODONE-ACETAMINOPHEN 5-325 MG PO TABS: 1.0000 | ORAL_TABLET | Freq: Four times a day (QID) | ORAL | 0 refills | Status: DC | PRN

## 2017-11-19 MED ORDER — CLINDAMYCIN PHOSPHATE 600 MG/50ML IV SOLN
600.0000 mg | Freq: Once | INTRAVENOUS | Status: AC
Start: 2017-11-19 — End: 2017-11-19
  Administered 2017-11-19: 600 mg via INTRAVENOUS
  Filled 2017-11-19: qty 50

## 2017-11-19 MED ORDER — CLINDAMYCIN HCL 150 MG PO CAPS: ORAL_CAPSULE | ORAL | 0 refills | Status: DC

## 2017-11-19 NOTE — ED Provider Notes (Signed)
[facility] Emergency Department Provider Note  ___________________________________________   First MD Initiated Contact with Patient 11/19/17 1157     (approximate)  I have reviewed the triage vital signs and the nursing notes.   HISTORY  Chief Complaint Abscess  HPI Joyce Gonzalez is a 42 y.o. female presents to the emergency department with swelling above her left eyebrow.  Patient states that it started out as a pimple about a week ago and has progressively gotten worse.  Patient states that last evening it began swelling.  She denies any fever, chills, nausea or vomiting.  She denies any visual changes.  Patient currently was taking Zithromax for sinus issues.  She also complains of pain in the area for which over-the-counter medication has not helped.  She rates her pain as 6 out of 10.   Past Medical History:  Diagnosis Date  . Amenorrhea   . Anxiety   . Hypertension   . Migraines     Patient Active Problem List   Diagnosis Date Noted  . Contracted pelvis during pregnancy 09/30/2016  . Preterm delivery 09/30/2016  . History of preterm delivery, currently pregnant in second trimester 09/22/2016  . Cervical incompetence affecting management of pregnancy in second trimester, antepartum 09/22/2016  . Essential hypertension 07/28/2016  . Iron deficiency anemia 07/28/2016  . Osteoarthritis 07/28/2016  . Asthma, well controlled, mild intermittent 07/28/2016  . Seasonal allergies 07/28/2016  . History of alcohol abuse 07/28/2016  . Hypertension     Past Surgical History:  Procedure Laterality Date  . CERVICAL CERCLAGE N/A 09/12/2016   Procedure: CERCLAGE CERVICAL;  Surgeon: Hildred Laser, MD;  Location: [facility] ORS;  Service: Gynecology;  Laterality: N/A;  . MENISCUS REPAIR Right     Prior to Admission medications   Medication Sig Start Date End Date Taking? Authorizing Provider  albuterol (PROVENTIL HFA;VENTOLIN HFA) 108 (90 Base) MCG/ACT  inhaler Inhale 1-2 puffs into the lungs every 6 (six) hours as needed for wheezing or shortness of breath. 10/22/15   Lutricia Feil, PA-C  clindamycin (CLEOCIN) 150 MG capsule Take 2 capsules every 6 hours until finished. 11/19/17   Tommi Rumps, PA-C  HYDROcodone-acetaminophen (NORCO/VICODIN) 5-325 MG tablet Take 1 tablet by mouth every 6 (six) hours as needed for moderate pain. 11/19/17   Tommi Rumps, PA-C  predniSONE (DELTASONE) 20 MG tablet Take 1 tablet (20 mg total) by mouth daily. 10/05/16   Payton Mccallum, MD    Allergies Magnesium-containing compounds  Family History  Problem Relation Age of Onset  . Osteoarthritis Mother   . Asthma Mother   . Diabetes Mother   . Hyperlipidemia Mother   . Migraines Mother   . Diabetes Father   . Osteoarthritis Maternal Aunt   . Rheum arthritis Maternal Uncle   . Heart failure Maternal Uncle   . Hypertension Maternal Uncle   . Diabetes Paternal Uncle   . Asthma Maternal Grandmother   . Heart failure Maternal Grandmother   . Diabetes Paternal Grandmother   . Rashes / Skin problems Daughter        eczema  . Alzheimer's disease Other        family history    Social History Social History   Tobacco Use  . Smoking status: Former Smoker    Packs/day: 0.50    Types: Cigarettes  . Smokeless tobacco: Never Used  Substance Use Topics  . Alcohol use: No    Comment: not now, drank heavy before she found out  .  Drug use: No    Review of Systems Constitutional: No fever/chills Eyes: No visual changes. ENT: No sore throat. Cardiovascular: Denies chest pain. Respiratory: Denies shortness of breath. Gastrointestinal:   No nausea, no vomiting.  Musculoskeletal: Negative for muscle aches. Skin: Positive for swelling and tenderness left forehead. Neurological: Negative for headaches, focal weakness or numbness. ____________________________________________   PHYSICAL EXAM:  VITAL SIGNS: ED Triage Vitals  Enc Vitals Group      BP 11/19/17 1128 (!) 147/82     Pulse Rate 11/19/17 1128 (!) 109     Resp 11/19/17 1128 18     Temp 11/19/17 1128 98.4 F (36.9 C)     Temp Source 11/19/17 1128 Oral     SpO2 11/19/17 1128 97 %     Weight 11/19/17 1129 185 lb (83.9 kg)     Height 11/19/17 1129 5\' 6"  (1.676 m)     Head Circumference --      Peak Flow --      Pain Score 11/19/17 1129 6     Pain Loc --      Pain Edu? --      Excl. in GC? --    Constitutional: Alert and oriented. Well appearing and in no acute distress. Eyes: Conjunctivae are normal. PERRL. EOMI. Head: Atraumatic. Nose: No congestion/rhinnorhea. Neck: No stridor.   Hematological/Lymphatic/Immunilogical: Minimal left cervical lymphadenopathy. Cardiovascular: Normal rate, regular rhythm. Grossly normal heart sounds.  Good peripheral circulation. Respiratory: Normal respiratory effort.  No retractions. Lungs CTAB. Musculoskeletal: No lower extremity tenderness nor edema.  No joint effusions. Neurologic:  Normal speech and language. No gross focal neurologic deficits are appreciated. No gait instability. Skin:  Skin is warm, dry and intact.  On examination of left forehead there is some soft tissue tenderness and mild erythema.  There is an open lesion that appears to be old without drainage or foreign body.  Area is slightly warm to touch. Psychiatric: Mood and affect are normal. Speech and behavior are normal.  ____________________________________________   LABS (all labs ordered are listed, but only abnormal results are displayed)  Labs Reviewed  COMPREHENSIVE METABOLIC PANEL - Abnormal; Notable for the following components:      Result Value   Glucose, Bld 126 (*)    Calcium 8.3 (*)    Total Protein 5.5 (*)    Albumin 3.4 (*)    Alkaline Phosphatase 36 (*)    Total Bilirubin 0.2 (*)    All other components within normal limits  CBC WITH DIFFERENTIAL/PLATELET - Abnormal; Notable for the following components:   RDW 16.2 (*)    All other  components within normal limits  POC URINE PREG, ED  POCT PREGNANCY, URINE   ____________________________________________   PROCEDURES  Procedure(s) performed: None  Procedures  Critical Care performed: No  ____________________________________________   INITIAL IMPRESSION / ASSESSMENT AND PLAN / ED COURSE  As part of my medical decision making, I reviewed the following data within the electronic MEDICAL RECORD NUMBER Notes from prior ED visits and [place] Controlled Substance Database  Patient presents with a left sided forehead soft tissue edema that is questionable for cellulitis.  Lab work was reassuring and patient was given clindamycin IV while in the department along with Norco as needed for pain.  Patient was reassured and she is to use warm moist compresses to the area frequently.  She was discharged with a prescription for clindamycin and Norco.  She is to follow-up with her PCP if any continued problems and return  to the emergency department if any severe worsening of her symptoms.  ____________________________________________   FINAL CLINICAL IMPRESSION(S) / ED DIAGNOSES  Final diagnoses:  Cellulitis of face     ED Discharge Orders        Ordered    clindamycin (CLEOCIN) 150 MG capsule     11/19/17 1402    HYDROcodone-acetaminophen (NORCO/VICODIN) 5-325 MG tablet  Every 6 hours PRN     11/19/17 1402       Note:  This document was prepared using Dragon voice recognition software and may include unintentional dictation errors.    Tommi RumpsSummers, Vernisha Bacote L, PA-C 11/19/17 1441    Minna AntisPaduchowski, Kevin, MD 11/19/17 1530

## 2017-11-19 NOTE — ED Notes (Signed)

## 2017-11-19 NOTE — Discharge Instructions (Signed)
Begin taking medication only as directed.  Clindamycin 2 capsules every 6 hours until finished.  Norco 1 every 6 hours as needed for moderate pain.  You may continue taking ibuprofen as needed for pain.  Warm moist compresses to your face 2-3 times per day.  Follow-up with your primary care provider for reevaluation of your infection.  Return to the emergency department if any severe worsening of your symptoms or fever above 101.

## 2017-11-19 NOTE — ED Triage Notes (Signed)
Patient presents to the ED with pain, redness and swelling to area above patient's left eye.  Swelling and redness does not appear to have spread to eye/eyelid.  Patient states, "it started out with a big pimple, about a week ago, and now it really hurts and even my ear hurts."  Patient is in no obvious distress at this time.

## 2017-11-25 ENCOUNTER — Ambulatory Visit
Admission: EM | Admit: 2017-11-25 | Discharge: 2017-11-25 | Disposition: A | Discharge status: Home / Self Care | Payer: Medicaid Other | Attending: Family Medicine | Admitting: Family Medicine

## 2017-11-25 ENCOUNTER — Encounter: Payer: Self-pay | Admitting: Gynecology

## 2017-11-25 ENCOUNTER — Other Ambulatory Visit: Payer: Self-pay

## 2017-11-25 DIAGNOSIS — H5789 Other specified disorders of eye and adnexa: Secondary | ICD-10-CM | POA: Diagnosis not present

## 2017-11-25 DIAGNOSIS — R21 Rash and other nonspecific skin eruption: Secondary | ICD-10-CM

## 2017-11-25 MED ORDER — METHYLPREDNISOLONE SODIUM SUCC 40 MG IJ SOLR
80.0000 mg | Freq: Once | INTRAMUSCULAR | Status: AC
  Administered 2017-11-25: 80 mg via INTRAMUSCULAR

## 2017-11-25 MED ORDER — PREDNISONE 10 MG PO TABS: ORAL_TABLET | ORAL | 0 refills | Status: DC

## 2017-11-25 MED ORDER — MUPIROCIN 2 % EX OINT: 1.0000 "application " | TOPICAL_OINTMENT | Freq: Two times a day (BID) | CUTANEOUS | 0 refills | Status: DC

## 2017-11-25 NOTE — Discharge Instructions (Signed)
Medications as prescribed. ° °If you worsen, go to the ER. ° °Take care ° °Dr. Zakyia Gagan  °

## 2017-11-25 NOTE — ED Provider Notes (Signed)
[facility]    CSN: [id number] Arrival date & time: 11/25/17  1037  History   Chief Complaint Chief Complaint  Patient presents with  . Cellulitis   HPI  42 year old female presents with facial rash/swelling/drainage.  Patient was seen on 4/14 in the ER.  There was concern for cellulitis.  She was placed on clindamycin.  Patient states that she is been improving but worsened as of this morning.  She has erythema on the right side of her face with raised areas particularly on the forehead (left).  She has a healing forehead wound.  Honey colored drainage noted from vesicles around this region.  Eyelid edema/periorbital edema noted.  Patient states that the area itches quite a bit.  She endorses compliance with her clindamycin.  No fever.  No other associated symptoms.  No other complaints concerns this time.  Past Medical History:  Diagnosis Date  . Amenorrhea   . Anxiety   . Hypertension   . Migraines    Patient Active Problem List   Diagnosis Date Noted  . Contracted pelvis during pregnancy 09/30/2016  . Preterm delivery 09/30/2016  . History of preterm delivery, currently pregnant in second trimester 09/22/2016  . Cervical incompetence affecting management of pregnancy in second trimester, antepartum 09/22/2016  . Essential hypertension 07/28/2016  . Iron deficiency anemia 07/28/2016  . Osteoarthritis 07/28/2016  . Asthma, well controlled, mild intermittent 07/28/2016  . Seasonal allergies 07/28/2016  . History of alcohol abuse 07/28/2016  . Hypertension    Past Surgical History:  Procedure Laterality Date  . CERVICAL CERCLAGE N/A 09/12/2016   Procedure: CERCLAGE CERVICAL;  Surgeon: Hildred Laser, MD;  Location: [facility] ORS;  Service: Gynecology;  Laterality: N/A;  . MENISCUS REPAIR Right    OB History    Gravida  4   Para  3   Term  0   Preterm  2   AB  1   Living  2     SAB  1   TAB  0   Ectopic  0   Multiple  0   Live Births  2        Obstetric Comments  04/2004 pt called this a stillbirth.       Home Medications    Prior to Admission medications   Medication Sig Start Date End Date Taking? Authorizing Provider  azithromycin (ZITHROMAX) 250 MG tablet Take by mouth daily. 11/11/17  Yes [provider]  clindamycin (CLEOCIN) 150 MG capsule Take 2 capsules every 6 hours until finished. 11/19/17  Yes Bridget Hartshorn L, PA-C  albuterol (PROVENTIL HFA;VENTOLIN HFA) 108 (90 Base) MCG/ACT inhaler Inhale 1-2 puffs into the lungs every 6 (six) hours as needed for wheezing or shortness of breath. 10/22/15   Lutricia Feil, PA-C  mupirocin ointment (BACTROBAN) 2 % Apply 1 application topically 2 (two) times daily. 11/25/17   Tommie Sams, DO  predniSONE (DELTASONE) 10 MG tablet 50 mg daily x 3 days, then 40 mg daily x 3 days, then 30 mg daily x 3 days, then 20 mg daily x 3 days, then 10 mg daily x 3 days. 11/25/17   Tommie Sams, DO    Family History Family History  Problem Relation Age of Onset  . Osteoarthritis Mother   . Asthma Mother   . Diabetes Mother   . Hyperlipidemia Mother   . Migraines Mother   . Diabetes Father   . Osteoarthritis Maternal Aunt   . Rheum arthritis Maternal Uncle   .  Heart failure Maternal Uncle   . Hypertension Maternal Uncle   . Diabetes Paternal Uncle   . Asthma Maternal Grandmother   . Heart failure Maternal Grandmother   . Diabetes Paternal Grandmother   . Rashes / Skin problems Daughter        eczema  . Alzheimer's disease Other        family history    Social History Social History   Tobacco Use  . Smoking status: Former Smoker    Packs/day: 0.50    Types: Cigarettes  . Smokeless tobacco: Never Used  Substance Use Topics  . Alcohol use: No    Comment: not now, drank heavy before she found out  . Drug use: No     Allergies   Magnesium-containing compounds   Review of Systems Review of Systems  Constitutional: Negative.   Eyes:       Swelling of the left eye.   Skin: Positive for rash and wound.   Physical Exam Triage Vital Signs ED Triage Vitals  Enc Vitals Group     BP 11/25/17 1053 127/85     Pulse Rate 11/25/17 1053 85     Resp 11/25/17 1053 16     Temp 11/25/17 1053 (!) 97.5 F (36.4 C)     Temp Source 11/25/17 1053 Oral     SpO2 11/25/17 1053 99 %     Weight 11/25/17 1052 185 lb (83.9 kg)     Height --      Head Circumference --      Peak Flow --      Pain Score 11/25/17 1052 5     Pain Loc --      Pain Edu? --      Excl. in GC? --    Updated Vital Signs BP 127/85 (BP Location: Left Arm)   Pulse 85   Temp (!) 97.5 F (36.4 C) (Oral)   Resp 16   Wt 185 lb (83.9 kg)   LMP 11/02/2017 (Approximate)   SpO2 99%   BMI 29.86 kg/m   Physical Exam  Constitutional: She is oriented to person, place, and time. She appears well-developed. No distress.  HENT:  Head:    Normocephalic atraumatic. Patient with vesicles noted in the labeled region with honey colored drainage.  Patient has a healing wound as well.  Eschar noted.  Eyes:  Edema of the left eye noted.  Pulmonary/Chest: Effort normal. No respiratory distress.  Neurological: She is alert and oriented to person, place, and time.  Skin:  Left side of the face and also scattered areas around the mouth with erythematous, raised rash.  Psychiatric: She has a normal mood and affect. Her behavior is normal.  Nursing note and vitals reviewed.  UC Treatments / Results  Labs (all labs ordered are listed, but only abnormal results are displayed) Labs Reviewed - No data to display  EKG None Radiology No results found.  Procedures Procedures (including critical care time)  Medications Ordered in UC Medications  methylPREDNISolone sodium succinate (SOLU-MEDROL) 40 mg/mL injection 80 mg (80 mg Intramuscular Given 11/25/17 1141)     Initial Impression / Assessment and Plan / UC Course  I have reviewed the triage vital signs and the nursing notes.  Pertinent labs &  imaging results that were available during my care of the patient were reviewed by me and considered in my medical decision making (see chart for details).     42 year old female presents with perioral swelling and rash.  Advised  to continue clindamycin.  Adding mupirocin given drainage.  I am concerned that she came into contact with some sort of allergen/irritant possibly poison oak or poison ivy.  She states that she has been in the garden recently prior to the development of symptoms.   I am placing her on prednisone.  Final Clinical Impressions(s) / UC Diagnoses   Final diagnoses:  Rash  Periorbital swelling    ED Discharge Orders        Ordered    predniSONE (DELTASONE) 10 MG tablet     11/25/17 1133    mupirocin ointment (BACTROBAN) 2 %  2 times daily     11/25/17 1133     Controlled Substance Prescriptions [place] Controlled Substance Registry consulted? Not Applicable   Tommie Sams, DO 11/25/17 1200

## 2017-11-25 NOTE — ED Triage Notes (Signed)
Per patient was seen [facility] x 1 week ago and was treated for cellulitis above her left eye. Per Patient now with rash / drainage and itching on face.

## 2018-02-01 ENCOUNTER — Other Ambulatory Visit: Payer: Self-pay | Admitting: Student

## 2018-02-01 DIAGNOSIS — M5412 Radiculopathy, cervical region: Secondary | ICD-10-CM

## 2018-02-19 ENCOUNTER — Ambulatory Visit
Admission: RE | Admit: 2018-02-19 | Discharge: 2018-02-19 | Disposition: A | Discharge status: Home / Self Care | Payer: Medicaid Other | Source: Ambulatory Visit | Attending: Student | Admitting: Student

## 2018-02-19 DIAGNOSIS — M4722 Other spondylosis with radiculopathy, cervical region: Secondary | ICD-10-CM | POA: Diagnosis not present

## 2018-02-19 DIAGNOSIS — M5412 Radiculopathy, cervical region: Secondary | ICD-10-CM | POA: Diagnosis present

## 2018-02-19 DIAGNOSIS — M50222 Other cervical disc displacement at C5-C6 level: Secondary | ICD-10-CM | POA: Diagnosis not present

## 2018-07-06 ENCOUNTER — Ambulatory Visit
Admission: EM | Admit: 2018-07-06 | Discharge: 2018-07-06 | Disposition: A | Discharge status: Home / Self Care | Payer: Medicaid Other | Attending: Family Medicine | Admitting: Family Medicine

## 2018-07-06 ENCOUNTER — Encounter: Payer: Self-pay | Admitting: Emergency Medicine

## 2018-07-06 ENCOUNTER — Other Ambulatory Visit: Payer: Self-pay

## 2018-07-06 DIAGNOSIS — J209 Acute bronchitis, unspecified: Secondary | ICD-10-CM | POA: Diagnosis not present

## 2018-07-06 MED ORDER — DOXYCYCLINE HYCLATE 100 MG PO CAPS: 100.0000 mg | ORAL_CAPSULE | Freq: Two times a day (BID) | ORAL | 0 refills | Status: DC

## 2018-07-06 MED ORDER — PREDNISONE 50 MG PO TABS: ORAL_TABLET | ORAL | 0 refills | Status: DC

## 2018-07-06 MED ORDER — HYDROCOD POLST-CPM POLST ER 10-8 MG/5ML PO SUER: 5.0000 mL | Freq: Every evening | ORAL | 0 refills | Status: DC | PRN

## 2018-07-06 MED ORDER — ALBUTEROL SULFATE HFA 108 (90 BASE) MCG/ACT IN AERS: 1.0000 | INHALATION_SPRAY | Freq: Four times a day (QID) | RESPIRATORY_TRACT | 0 refills | Status: DC | PRN

## 2018-07-06 NOTE — ED Triage Notes (Signed)
Patient c/o sinus congestion, stuffy nose, cough and chest congestion for over a week.  Patient denies recent fevers.

## 2018-07-06 NOTE — ED Provider Notes (Signed)
[facility]    CSN: [id number] Arrival date & time: 07/06/18  1430  History   Chief Complaint Chief Complaint  Patient presents with  . Cough   HPI  42 year old female presents with respiratory symptoms.  Patient states she is been sick for the past week.  She reports sinus congestion and pressure, cough, chest congestion.  Also reports ear pain and sore throat.  Her primary complaint is cough.  Cough is severe.  Productive.  No fever.  No chills.  No exacerbating or relieving factors.  Symptoms are severe.  No other symptoms.  No other complaints.  PMH, Surgical Hx, Family Hx, Social History reviewed and updated as below.  Past Medical History:  Diagnosis Date  . Amenorrhea   . Anxiety   . Hypertension   . Migraines    Patient Active Problem List   Diagnosis Date Noted  . Contracted pelvis during pregnancy 09/30/2016  . Preterm delivery 09/30/2016  . History of preterm delivery, currently pregnant in second trimester 09/22/2016  . Cervical incompetence affecting management of pregnancy in second trimester, antepartum 09/22/2016  . Essential hypertension 07/28/2016  . Iron deficiency anemia 07/28/2016  . Osteoarthritis 07/28/2016  . Asthma, well controlled, mild intermittent 07/28/2016  . Seasonal allergies 07/28/2016  . History of alcohol abuse 07/28/2016  . Hypertension    Past Surgical History:  Procedure Laterality Date  . CERVICAL CERCLAGE N/A 09/12/2016   Procedure: CERCLAGE CERVICAL;  Surgeon: Hildred Laser, MD;  Location: [facility] ORS;  Service: Gynecology;  Laterality: N/A;  . MENISCUS REPAIR Right    Family History Family History  Problem Relation Age of Onset  . Osteoarthritis Mother   . Asthma Mother   . Diabetes Mother   . Hyperlipidemia Mother   . Migraines Mother   . Diabetes Father   . Osteoarthritis Maternal Aunt   . Rheum arthritis Maternal Uncle   . Heart failure Maternal Uncle   . Hypertension Maternal Uncle   . Diabetes Paternal  Uncle   . Asthma Maternal Grandmother   . Heart failure Maternal Grandmother   . Diabetes Paternal Grandmother   . Rashes / Skin problems Daughter        eczema  . Alzheimer's disease Other        family history   Social History Social History   Tobacco Use  . Smoking status: Former Smoker    Packs/day: 0.50    Types: Cigarettes  . Smokeless tobacco: Never Used  Substance Use Topics  . Alcohol use: No    Comment: not now, drank heavy before she found out  . Drug use: No   Allergies   Magnesium-containing compounds   Review of Systems Review of Systems Per HPI  Physical Exam Triage Vital Signs ED Triage Vitals  Enc Vitals Group     BP 07/06/18 1500 134/78     Pulse Rate 07/06/18 1500 77     Resp 07/06/18 1500 16     Temp 07/06/18 1500 97.6 F (36.4 C)     Temp Source 07/06/18 1500 Oral     SpO2 07/06/18 1500 98 %     Weight 07/06/18 1457 180 lb (81.6 kg)     Height 07/06/18 1457 5\' 6"  (1.676 m)     Head Circumference --      Peak Flow --      Pain Score 07/06/18 1457 4     Pain Loc --      Pain Edu? --  Excl. in GC? --    Updated Vital Signs BP 134/78 (BP Location: Left Arm)   Pulse 77   Temp 97.6 F (36.4 C) (Oral)   Resp 16   Ht 5\' 6"  (1.676 m)   Wt 81.6 kg   LMP 06/29/2018 (Approximate)   SpO2 98%   BMI 29.05 kg/m   Visual Acuity Right Eye Distance:   Left Eye Distance:   Bilateral Distance:    Right Eye Near:   Left Eye Near:    Bilateral Near:     Physical Exam  Constitutional: She is oriented to person, place, and time. She appears well-developed. No distress.  HENT:  Head: Normocephalic and atraumatic.  Mouth/Throat: Oropharynx is clear and moist.  Cardiovascular: Normal rate and regular rhythm.  Pulmonary/Chest: Effort normal and breath sounds normal. She has no wheezes. She has no rales.  Neurological: She is alert and oriented to person, place, and time.  Psychiatric: She has a normal mood and affect. Her behavior is normal.    Nursing note and vitals reviewed.  UC Treatments / Results  Labs (all labs ordered are listed, but only abnormal results are displayed) Labs Reviewed - No data to display  EKG None  Radiology No results found.  Procedures Procedures (including critical care time)  Medications Ordered in UC Medications - No data to display  Initial Impression / Assessment and Plan / UC Course  I have reviewed the triage vital signs and the nursing notes.  Pertinent labs & imaging results that were available during my care of the patient were reviewed by me and considered in my medical decision making (see chart for details).    42 year old female presents with respiratory infection.  Given persistent symptoms and worsening nature as well as underlying asthma I am treating her with albuterol, prednisone, Doxy, Tussionex.   Final Clinical Impressions(s) / UC Diagnoses   Final diagnoses:  Acute bronchitis, unspecified organism     Discharge Instructions     Rest. Fluids.  Medications as prescribed.  Take care  Dr. Adriana Simasook    ED Prescriptions    Medication Sig Dispense Auth. Provider   albuterol (PROVENTIL HFA;VENTOLIN HFA) 108 (90 Base) MCG/ACT inhaler Inhale 1-2 puffs into the lungs every 6 (six) hours as needed for wheezing or shortness of breath. 1 Inhaler Keene Gilkey G, DO   predniSONE (DELTASONE) 50 MG tablet 1 tablet daily x 5 days 5 tablet Dawn Kiper G, DO   doxycycline (VIBRAMYCIN) 100 MG capsule Take 1 capsule (100 mg total) by mouth 2 (two) times daily. 14 capsule [place], [place] Valley[place] G, DO   chlorpheniramine-HYDROcodone (TUSSIONEX PENNKINETIC ER) 10-8 MG/5ML SUER Take 5 mLs by mouth at bedtime as needed. 60 mL Tommie Samsook, Artelia Game G, DO     Controlled Substance Prescriptions [place] Controlled Substance Registry consulted? Not Applicable   Tommie SamsCook, [place] Rylander G, DO 07/06/18 1636

## 2018-07-06 NOTE — Discharge Instructions (Signed)
Rest. ° °Fluids. ° °Medications as prescribed. ° °Take care ° °Dr. Traxton Kolenda  °

## 2019-01-15 ENCOUNTER — Other Ambulatory Visit: Payer: Self-pay | Admitting: Family Medicine

## 2019-02-20 ENCOUNTER — Emergency Department: Payer: Medicaid Other

## 2019-02-20 ENCOUNTER — Encounter: Payer: Self-pay | Admitting: Emergency Medicine

## 2019-02-20 ENCOUNTER — Observation Stay
Admission: EM | Admit: 2019-02-20 | Discharge: 2019-02-21 | Disposition: A | Discharge status: Home / Self Care | Payer: Medicaid Other | Attending: Internal Medicine | Admitting: Internal Medicine

## 2019-02-20 ENCOUNTER — Other Ambulatory Visit: Payer: Self-pay

## 2019-02-20 DIAGNOSIS — G43909 Migraine, unspecified, not intractable, without status migrainosus: Secondary | ICD-10-CM | POA: Insufficient documentation

## 2019-02-20 DIAGNOSIS — Z79899 Other long term (current) drug therapy: Secondary | ICD-10-CM | POA: Insufficient documentation

## 2019-02-20 DIAGNOSIS — Z1159 Encounter for screening for other viral diseases: Secondary | ICD-10-CM | POA: Insufficient documentation

## 2019-02-20 DIAGNOSIS — M50222 Other cervical disc displacement at C5-C6 level: Secondary | ICD-10-CM | POA: Diagnosis not present

## 2019-02-20 DIAGNOSIS — Y901 Blood alcohol level of 20-39 mg/100 ml: Secondary | ICD-10-CM | POA: Insufficient documentation

## 2019-02-20 DIAGNOSIS — R4182 Altered mental status, unspecified: Secondary | ICD-10-CM | POA: Diagnosis present

## 2019-02-20 DIAGNOSIS — F419 Anxiety disorder, unspecified: Secondary | ICD-10-CM | POA: Insufficient documentation

## 2019-02-20 DIAGNOSIS — F10129 Alcohol abuse with intoxication, unspecified: Secondary | ICD-10-CM | POA: Insufficient documentation

## 2019-02-20 DIAGNOSIS — G9340 Encephalopathy, unspecified: Secondary | ICD-10-CM | POA: Diagnosis not present

## 2019-02-20 DIAGNOSIS — Z87891 Personal history of nicotine dependence: Secondary | ICD-10-CM | POA: Diagnosis not present

## 2019-02-20 DIAGNOSIS — I160 Hypertensive urgency: Secondary | ICD-10-CM | POA: Insufficient documentation

## 2019-02-20 DIAGNOSIS — I10 Essential (primary) hypertension: Secondary | ICD-10-CM | POA: Diagnosis not present

## 2019-02-20 LAB — SALICYLATE LEVEL: Salicylate Lvl: <7 mg/dL (ref 2.8–30.0)

## 2019-02-20 LAB — COMPREHENSIVE METABOLIC PANEL
ALT: 55 U/L — ABNORMAL HIGH (ref 0–44)
AST: 56 U/L — ABNORMAL HIGH (ref 15–41)
Albumin: 4.5 g/dL (ref 3.5–5.0)
Alkaline Phosphatase: 48 U/L (ref 38–126)
Anion gap: 11 (ref 5–15)
BUN: 8 mg/dL (ref 6–20)
CO2: 19 mmol/L — ABNORMAL LOW (ref 22–32)
Calcium: 8.8 mg/dL — ABNORMAL LOW (ref 8.9–10.3)
Chloride: 110 mmol/L (ref 98–111)
Creatinine, Ser: 0.61 mg/dL (ref 0.44–1.00)
GFR calc Af Amer: >60 mL/min (ref >60)
GFR calc non Af Amer: >60 mL/min (ref >60)
Glucose, Bld: 135 mg/dL — ABNORMAL HIGH (ref 70–99)
Potassium: 3.7 mmol/L (ref 3.5–5.1)
Sodium: 140 mmol/L (ref 135–145)
Total Bilirubin: 0.7 mg/dL (ref 0.3–1.2)
Total Protein: 7.4 g/dL (ref 6.5–8.1)

## 2019-02-20 LAB — CBC WITH DIFFERENTIAL/PLATELET
Abs Immature Granulocytes: 0.03 10*3/uL (ref 0.00–0.07)
Basophils Absolute: 0 10*3/uL (ref 0.0–0.1)
Basophils Relative: 1 %
Eosinophils Absolute: 0.1 10*3/uL (ref 0.0–0.5)
Eosinophils Relative: 1 %
HCT: 44.9 % (ref 36.0–46.0)
Hemoglobin: 15 g/dL (ref 12.0–15.0)
Immature Granulocytes: 1 %
Lymphocytes Relative: 31 %
Lymphs Abs: 2 10*3/uL (ref 0.7–4.0)
MCH: 31.5 pg (ref 26.0–34.0)
MCHC: 33.4 g/dL (ref 30.0–36.0)
MCV: 94.3 fL (ref 80.0–100.0)
Monocytes Absolute: 0.4 10*3/uL (ref 0.1–1.0)
Monocytes Relative: 6 %
Neutro Abs: 4 10*3/uL (ref 1.7–7.7)
Neutrophils Relative %: 60 %
Platelets: 202 10*3/uL (ref 150–400)
RBC: 4.76 MIL/uL (ref 3.87–5.11)
RDW: 14.1 % (ref 11.5–15.5)
WBC: 6.6 10*3/uL (ref 4.0–10.5)
nRBC: 0 % (ref 0.0–0.2)

## 2019-02-20 LAB — ACETAMINOPHEN LEVEL: Acetaminophen (Tylenol), Serum: <10 ug/mL — ABNORMAL LOW (ref 10–30)

## 2019-02-20 LAB — PROTIME-INR
INR: 1.2 (ref 0.8–1.2)
Prothrombin Time: 15 seconds (ref 11.4–15.2)

## 2019-02-20 LAB — ETHANOL: Alcohol, Ethyl (B): 26 mg/dL — ABNORMAL HIGH (ref <10)

## 2019-02-20 MED ORDER — HALOPERIDOL LACTATE 5 MG/ML IJ SOLN
5.0000 mg | Freq: Once | INTRAMUSCULAR | Status: AC
  Administered 2019-02-20: 5 mg via INTRAVENOUS
  Filled 2019-02-20: qty 1

## 2019-02-20 MED ORDER — THIAMINE HCL 100 MG/ML IJ SOLN
Freq: Every day | INTRAVENOUS | Status: DC
  Administered 2019-02-21: 10:00:00 via INTRAVENOUS
  Filled 2019-02-20: qty 1000

## 2019-02-20 MED ORDER — LORAZEPAM 2 MG/ML IJ SOLN
2.0000 mg | Freq: Once | INTRAMUSCULAR | Status: AC
  Administered 2019-02-20: 2 mg via INTRAVENOUS
  Filled 2019-02-20: qty 1

## 2019-02-20 MED ORDER — ONDANSETRON HCL 4 MG/2ML IJ SOLN: 4.0000 mg | Freq: Four times a day (QID) | INTRAMUSCULAR | Status: DC | PRN

## 2019-02-20 MED ORDER — SODIUM CHLORIDE 0.9% FLUSH
3.0000 mL | Freq: Two times a day (BID) | INTRAVENOUS | Status: DC
  Administered 2019-02-21: 3 mL via INTRAVENOUS

## 2019-02-20 MED ORDER — ONDANSETRON HCL 4 MG PO TABS: 4.0000 mg | ORAL_TABLET | Freq: Four times a day (QID) | ORAL | Status: DC | PRN

## 2019-02-20 MED ORDER — ENOXAPARIN SODIUM 40 MG/0.4ML ~~LOC~~ SOLN: 40.0000 mg | SUBCUTANEOUS | Status: DC

## 2019-02-20 MED ORDER — SODIUM CHLORIDE 0.9 % IV SOLN
INTRAVENOUS | Status: DC
  Administered 2019-02-21: 02:00:00 via INTRAVENOUS

## 2019-02-20 MED ORDER — SODIUM CHLORIDE 0.9% FLUSH: 3.0000 mL | Freq: Two times a day (BID) | INTRAVENOUS | Status: DC

## 2019-02-20 MED ORDER — TRAZODONE HCL 50 MG PO TABS: 25.0000 mg | ORAL_TABLET | Freq: Every evening | ORAL | Status: DC | PRN

## 2019-02-20 MED ORDER — SODIUM CHLORIDE 0.9 % IV SOLN: 250.0000 mL | INTRAVENOUS | Status: DC | PRN

## 2019-02-20 MED ORDER — THIAMINE HCL 100 MG/ML IJ SOLN: 100.0000 mg | Freq: Once | INTRAMUSCULAR | Status: DC

## 2019-02-20 MED ORDER — ALBUTEROL SULFATE (2.5 MG/3ML) 0.083% IN NEBU: 2.5000 mg | INHALATION_SOLUTION | Freq: Four times a day (QID) | RESPIRATORY_TRACT | Status: DC | PRN

## 2019-02-20 MED ORDER — HALOPERIDOL LACTATE 5 MG/ML IJ SOLN
5.0000 mg | Freq: Once | INTRAMUSCULAR | Status: AC
  Administered 2019-02-20: 5 mg via INTRAVENOUS

## 2019-02-20 MED ORDER — ACETAMINOPHEN 325 MG PO TABS: 650.0000 mg | ORAL_TABLET | Freq: Four times a day (QID) | ORAL | Status: DC | PRN

## 2019-02-20 MED ORDER — ACETAMINOPHEN 650 MG RE SUPP: 650.0000 mg | Freq: Four times a day (QID) | RECTAL | Status: DC | PRN

## 2019-02-20 MED ORDER — SODIUM CHLORIDE 0.9% FLUSH: 3.0000 mL | INTRAVENOUS | Status: DC | PRN

## 2019-02-20 MED ORDER — HALOPERIDOL LACTATE 5 MG/ML IJ SOLN
INTRAMUSCULAR | Status: AC
  Filled 2019-02-20: qty 1

## 2019-02-20 NOTE — H&P (Signed)
[employer] [facility] [place] [facility]   PATIENT NAME: Joyce Gonzalez Ewell    MR#:  [id number]  DATE OF BIRTH:  [birth year]  DATE OF ADMISSION:  02/20/2019  PRIMARY CARE PHYSICIAN: Dan HumphreysM[place], [facility]   REQUESTING/REFERRING PHYSICIAN: Dorothea GlassmanMalinda, Paul, MD CHIEF COMPLAINT:   Chief Complaint  Patient presents with   Altered Mental Status   Alcohol Intoxication    HISTORY OF PRESENT ILLNESS:  Joyce Gonzalez Joyce Gonzalez  is a 43 y.o. fairly somnolent Caucasian female with a known history of alcohol abuse, hypertension, and anxiety and migraine, who presented to the emergency room with acute onset of altered mental status.  The patient hit her head against a cabinet door last night and possibly had a syncope.  Today around 3 PM she was noted by her husband to be walking without clothes and she was significantly agitated and combative in the ER.  No reported paresthesias or focal muscle weakness.  She usually drinks bad 100 ounces of alcoholic drinks per day and today she had 8 ounces.  The patient was significantly somnolent and snoring during my interview and difficult to arouse being responsive only to painful stimuli and therefore no history could be obtained from her.  Upon presentation to the emergency room, blood pressure was elevated 164/112 with a pulse of 110 respiratory rate of 18, temperature of 98.7 pulse extremity 95% on room air.  Labs revealed a CO2 of 19 with a blood glucose of 135 anion gap of 11, slightly elevated AST of 56 and ALT of 55 compared to 35/25 on 11/19/2017.  Her PT was 15 and INR 1.2 and urine pregnancy test came back negative.  However alcohol levels 26 with Tylenol level less than 10 and salicylate less than 7.  Noncontrasted head CT scan revealed right face soft tissue injury without underlying zygomatic fracture no acute intracranial abnormalities and C-spine CT showed no acute findings but given mild cervical spine motion artifact an MRI of the C-spine was  done and it revealed.  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  There was shallow left paracentral protrusion at C6-7 narrowing the ventral thecal sac with open right foramen and central canal and mild left foraminal narrowing.  The patient was given 2 mg of IV Ativan twice as well as 5 mg of IV Haldol twice and 100 mg IV thiamine emergency room given significant agitation, restlessness and combativeness.  She will be admitted to an observation medical monitored bed for further evaluation and management. PAST MEDICAL HISTORY:   Past Medical History:  Diagnosis Date   Amenorrhea    Anxiety    Hypertension    Migraines     PAST SURGICAL HISTORY:   Past Surgical History:  Procedure Laterality Date   CERVICAL CERCLAGE N/A 09/12/2016   Procedure: CERCLAGE CERVICAL;  Surgeon: Hildred LaserAnika Cherry, MD;  Location: [facility] ORS;  Service: Gynecology;  Laterality: N/A;   MENISCUS REPAIR Right     SOCIAL HISTORY:   Social History   Tobacco Use   Smoking status: Former Smoker    Packs/day: 0.50    Types: Cigarettes   Smokeless tobacco: Never Used  Substance Use Topics   Alcohol use: No    Comment: not now, drank heavy before she found out    FAMILY HISTORY:   Family History  Problem Relation Age of Onset   Osteoarthritis Mother  Asthma Mother    Diabetes Mother    Hyperlipidemia Mother    Migraines Mother    Diabetes Father    Osteoarthritis Maternal Aunt    Rheum arthritis Maternal Uncle    Heart failure Maternal Uncle    Hypertension Maternal Uncle    Diabetes Paternal Uncle    Asthma Maternal Grandmother    Heart failure Maternal Grandmother    Diabetes Paternal Grandmother    Rashes / Skin problems Daughter        eczema   Alzheimer's disease Other        family history    DRUG ALLERGIES:   Allergies  Allergen  Reactions   Magnesium-Containing Compounds     Pt had reaction when given this with pregnancy    REVIEW OF SYSTEMS:   ROS As per history of present illness. All pertinent systems were reviewed above. Constitutional,  HEENT, cardiovascular, respiratory, GI, GU, musculoskeletal, neuro, psychiatric, endocrine,  integumentary and hematologic systems were reviewed and are otherwise  negative/unremarkable except for positive findings mentioned above in the HPI.   MEDICATIONS AT HOME:   Prior to Admission medications   Medication Sig Start Date End Date Taking? Authorizing Provider  albuterol (PROVENTIL HFA;VENTOLIN HFA) 108 (90 Base) MCG/ACT inhaler Inhale 1-2 puffs into the lungs every 6 (six) hours as needed for wheezing or shortness of breath. 07/06/18   Coral Spikes, DO  chlorpheniramine-HYDROcodone (TUSSIONEX PENNKINETIC ER) 10-8 MG/5ML SUER Take 5 mLs by mouth at bedtime as needed. 07/06/18   Coral Spikes, DO  doxycycline (VIBRAMYCIN) 100 MG capsule Take 1 capsule (100 mg total) by mouth 2 (two) times daily. 07/06/18   Coral Spikes, DO  predniSONE (DELTASONE) 50 MG tablet 1 tablet daily x 5 days 07/06/18   Coral Spikes, DO      VITAL SIGNS:  Blood pressure (!) 167/104, pulse (!) 115, temperature 97.8 F (36.6 C), temperature source Axillary, resp. rate (!) 24, height 5\' 6"  (1.676 m), weight 81.6 kg, SpO2 98 %.  PHYSICAL EXAMINATION:  Physical Exam  GENERAL:  43 y.o.-year-old lethargic and somnolent Caucasian patient lying in the bed with no acute distress.  EYES: Pupils equal, round, reactive to light and accommodation. No scleral icterus. Extraocular muscles intact.  HEENT: Head atraumatic except for right facial/zygomatic contusion, normocephalic. Oropharynx and nasopharynx clear.  NECK:  Supple, no jugular venous distention. No thyroid enlargement, no tenderness.  LUNGS: Normal breath sounds bilaterally, no wheezing, rales,rhonchi or crepitation. No use of accessory  muscles of respiration.  CARDIOVASCULAR: Regular rate and rhythm, S1, S2 normal. No murmurs, rubs, or gallops.  ABDOMEN: Soft, nondistended, nontender. Bowel sounds present. No organomegaly or mass.  EXTREMITIES: No pedal edema, cyanosis, or clubbing.  NEUROLOGIC: No lateralizing signs.  She was responsive only to pain stimuli.   PSYCHIATRIC: The patient was very somnolent and difficult to arouse. SKIN: No obvious rash, lesion, or ulcer.   LABORATORY PANEL:   CBC Recent Labs  Lab 02/20/19 2000  WBC 6.6  HGB 15.0  HCT 44.9  PLT 202   ------------------------------------------------------------------------------------------------------------------  Chemistries  Recent Labs  Lab 02/20/19 2000  NA 140  K 3.7  CL 110  CO2 19*  GLUCOSE 135*  BUN 8  CREATININE 0.61  CALCIUM 8.8*  AST 56*  ALT 55*  ALKPHOS 48  BILITOT 0.7   ------------------------------------------------------------------------------------------------------------------  Cardiac Enzymes No results for input(s): TROPONINI in the last 168 hours. ------------------------------------------------------------------------------------------------------------------  RADIOLOGY:  Ct Head Wo Contrast  Result Date: 02/20/2019  CLINICAL DATA:  43 year old female with altered mental status. Blunt head trauma a couple of days ago. EXAM: CT HEAD WITHOUT CONTRAST CT CERVICAL SPINE WITHOUT CONTRAST TECHNIQUE: Multidetector CT imaging of the head and cervical spine was performed following the standard protocol without intravenous contrast. Multiplanar CT image reconstructions of the cervical spine were also generated. COMPARISON:  Cervical spine MRI 02/19/2018. FINDINGS: CT HEAD FINDINGS Brain: No midline shift, ventriculomegaly, mass effect, evidence of mass lesion, intracranial hemorrhage or evidence of cortically based acute infarction. Gray-white matter differentiation is within normal limits throughout the brain. Vascular: No  suspicious intracranial vascular hyperdensity. Skull: Intact. 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 Other: Asymmetric right face soft tissue swelling and mild stranding overlying the right zygoma which appears intact. Other orbit and scalp soft tissues appear negative. CT CERVICAL SPINE FINDINGS Alignment: Cervicothoracic junction alignment is within normal limits. Reversed cervical lordosis. Bilateral posterior element alignment is within normal limits. Skull base and vertebrae: Visualized skull base is intact. No atlanto-occipital dissociation. Intermittent motion artifact. Cervical vertebrae appear intact. No acute osseous abnormality identified. Soft tissues and spinal canal: No prevertebral fluid or swelling. No visible canal hematoma. Partially retropharyngeal course of the carotids, normal variant. A mild motion artifact. Negative neck soft tissues. Disc levels: Lower cervical disc and endplate degeneration. Up to mild associated lower cervical spinal stenosis. Upper chest: Visible upper thoracic levels appear intact. Negative lung apices. IMPRESSION: 1. Right face soft tissue injury without underlying zygoma fracture. 2. Normal noncontrast CT appearance of the brain. 3. Mild cervical spine motion artifact, no acute traumatic injury identified in the cervical spine. Electronically Signed   By: Odessa FlemingH  Hall M.D.   On: 02/20/2019 22:40   Ct Cervical Spine Wo Contrast  Result Date: 02/20/2019 CLINICAL DATA:  43 year old female with altered mental status. Blunt head trauma a couple of days ago. EXAM: CT HEAD WITHOUT CONTRAST CT CERVICAL SPINE WITHOUT CONTRAST TECHNIQUE: Multidetector CT imaging of the head and cervical spine was performed following the standard protocol without intravenous contrast. Multiplanar CT image reconstructions of the cervical spine were also generated. COMPARISON:  Cervical spine MRI 02/19/2018. FINDINGS: CT  HEAD FINDINGS Brain: No midline shift, ventriculomegaly, mass effect, evidence of mass lesion, intracranial hemorrhage or evidence of cortically based acute infarction. Gray-white matter differentiation is within normal limits throughout the brain. Vascular: No suspicious intracranial vascular hyperdensity. Skull: Intact. 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 Other: Asymmetric right face soft tissue swelling and mild stranding overlying the right zygoma which appears intact. Other orbit and scalp soft tissues appear negative. CT CERVICAL SPINE FINDINGS Alignment: Cervicothoracic junction alignment is within normal limits. Reversed cervical lordosis. Bilateral posterior element alignment is within normal limits. Skull base and vertebrae: Visualized skull base is intact. No atlanto-occipital dissociation. Intermittent motion artifact. Cervical vertebrae appear intact. No acute osseous abnormality identified. Soft tissues and spinal canal: No prevertebral fluid or swelling. No visible canal hematoma. Partially retropharyngeal course of the carotids, normal variant. A mild motion artifact. Negative neck soft tissues. Disc levels: Lower cervical disc and endplate degeneration. Up to mild associated lower cervical spinal stenosis. Upper chest: Visible upper thoracic levels appear intact. Negative lung apices. IMPRESSION: 1. Right face soft tissue injury without underlying zygoma fracture. 2. Normal noncontrast CT appearance of the brain. 3. Mild cervical spine motion artifact, no acute traumatic injury identified in the cervical spine. Electronically Signed   By:  Odessa FlemingH  Hall M.D.   On: 02/20/2019 22:40      IMPRESSION AND PLAN:   1.  Acute encephalopathy with associated delirium that could be delirium tremens secondary to alcohol withdrawal.  This could have been exacerbated by recent head injury.  The patient will be admitted to a  medically monitored bed.  We will follow neuro checks every 4 hours for 24 hours.  She will be placed on PRN Ativan for alcohol withdrawal and a banana bag daily.  Will obtain a brain MRI without contrast in a.m. for further assessment.  2.  Syncope.  This could be associated with head injury and concussion.  She likely had a syncope given her right zygomatic contusion.  She will be monitored for arrhythmias.  We will check her orthostatics.  Will obtain a 2D echo in a.m.  She will be hydrated with normal saline.  3.  Hypertensive urgency.  She will be placed on PRN IV labetalol and hydralazine.  4.  Anxiety.  She will be placed on PRN Ativan as mentioned above.  5.  DVT prophylaxis.  Subcutaneous Lovenox.   All the records are reviewed and case discussed with ED provider. The plan of care was discussed in details with the patient (and family). I answered all questions. The patient agreed to proceed with the above mentioned plan. Further management will depend upon hospital course.   CODE STATUS: Full code  TOTAL TIME TAKING CARE OF THIS PATIENT: 50 minutes.    Hannah BeatJan A Wm Sahagun M.D on 02/20/2019 at 11:51 PM  Pager - [phone]  After 6pm go to [email or web address] - Social research officer, governmentpassword EPAS [facility]  [facility] [place] [facility]  Office  [phone]  CC: Primary care physician; Jerrilyn CairoM[place], [facility]   Note: This dictation was prepared with Dragon dictation along with smaller phrase technology. Any transcriptional errors that result from this process are unintentional.

## 2019-02-20 NOTE — ED Notes (Signed)
Pt to CT

## 2019-02-20 NOTE — ED Triage Notes (Signed)
Pt presents to ER from home via EMS with complaints of AMS, per husband around 1500 pt started to walk around naked around the house, reports a couple of days ago a cabinet fell on her head, pt does have a history of alcohol intoxication per EMS husband informed usually drinks about 96ounces of beer a day today only 48 ounces. Pt alert to self.

## 2019-02-20 NOTE — ED Notes (Signed)
Pt continues to be agitated, not staying in bed, unable to get CT done, per Dr. Cinda Quest received verbal orders to administer 5mg  Haldol IV once, and 2mg  Ativan IV once

## 2019-02-20 NOTE — ED Notes (Signed)
Administered 5mg  of Haldol IV per Dr. Sonny Dandy order pt combative in CT moving from table.
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
## 2019-02-21 ENCOUNTER — Observation Stay: Payer: Medicaid Other

## 2019-02-21 ENCOUNTER — Observation Stay
Admit: 2019-02-21 | Discharge: 2019-02-21 | Disposition: A | Discharge status: Home / Self Care | Payer: Medicaid Other | Attending: Family Medicine | Admitting: Family Medicine

## 2019-02-21 LAB — COMPREHENSIVE METABOLIC PANEL
ALT: 48 U/L — ABNORMAL HIGH (ref 0–44)
AST: 50 U/L — ABNORMAL HIGH (ref 15–41)
Albumin: 4.2 g/dL (ref 3.5–5.0)
Alkaline Phosphatase: 44 U/L (ref 38–126)
Anion gap: 14 (ref 5–15)
BUN: 10 mg/dL (ref 6–20)
CO2: 17 mmol/L — ABNORMAL LOW (ref 22–32)
Calcium: 8.3 mg/dL — ABNORMAL LOW (ref 8.9–10.3)
Chloride: 110 mmol/L (ref 98–111)
Creatinine, Ser: 0.64 mg/dL (ref 0.44–1.00)
GFR calc Af Amer: >60 mL/min (ref >60)
GFR calc non Af Amer: >60 mL/min (ref >60)
Glucose, Bld: 93 mg/dL (ref 70–99)
Potassium: 3.6 mmol/L (ref 3.5–5.1)
Sodium: 141 mmol/L (ref 135–145)
Total Bilirubin: 0.8 mg/dL (ref 0.3–1.2)
Total Protein: 6.9 g/dL (ref 6.5–8.1)

## 2019-02-21 LAB — URINE DRUG SCREEN, QUALITATIVE (ARMC ONLY)
Amphetamines, Ur Screen: NOT DETECTED
Barbiturates, Ur Screen: NOT DETECTED
Benzodiazepine, Ur Scrn: POSITIVE — AB
Cannabinoid 50 Ng, Ur ~~LOC~~: POSITIVE — AB
Cocaine Metabolite,Ur ~~LOC~~: NOT DETECTED
MDMA (Ecstasy)Ur Screen: NOT DETECTED
Methadone Scn, Ur: NOT DETECTED
Opiate, Ur Screen: NOT DETECTED
Phencyclidine (PCP) Ur S: NOT DETECTED
Tricyclic, Ur Screen: NOT DETECTED

## 2019-02-21 LAB — ECHOCARDIOGRAM COMPLETE
Height: 66 in
Weight: 2878.33 oz

## 2019-02-21 LAB — GLUCOSE, CAPILLARY
Glucose-Capillary: 77 mg/dL (ref 70–99)
Glucose-Capillary: 83 mg/dL (ref 70–99)

## 2019-02-21 MED ORDER — HYDRALAZINE HCL 20 MG/ML IJ SOLN: 10.0000 mg | Freq: Four times a day (QID) | INTRAMUSCULAR | Status: DC | PRN

## 2019-02-21 MED ORDER — LABETALOL HCL 5 MG/ML IV SOLN: 20.0000 mg | INTRAVENOUS | Status: DC | PRN

## 2019-02-21 MED ORDER — LORAZEPAM BOLUS VIA INFUSION: 1.0000 mg | INTRAVENOUS | Status: DC | PRN

## 2019-02-21 MED ORDER — LORAZEPAM 2 MG/ML IJ SOLN: 1.0000 mg | INTRAMUSCULAR | Status: DC | PRN

## 2019-02-21 NOTE — Progress Notes (Signed)
*  PRELIMINARY RESULTS* Echocardiogram 2D Echocardiogram has been performed.  Joyce Gonzalez 02/21/2019, 8:01 AM
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
## 2019-02-21 NOTE — Plan of Care (Signed)

## 2019-02-21 NOTE — ED Notes (Signed)
ED TO INPATIENT HANDOFF REPORT  ED Nurse Name and Phone #: Desmund Elman [phone]  S Name/Age/Gender Joyce Gonzalez 43 y.o. female Room/Bed: ED26A/ED26A  Code Status   Code Status: Full Code  Home/SNF/Other Home   Triage Complete: Triage complete  Chief Complaint Etoh Withdrawal  Triage Note Pt presents to ER from home via EMS with complaints of AMS, per husband around 1500 pt started to walk around naked around the house, reports a couple of days ago a cabinet fell on her head, pt does have a history of alcohol intoxication per EMS husband informed usually drinks about 96ounces of beer a day today only 48 ounces. Pt alert to self.    Allergies Allergies  Allergen Reactions  . Magnesium-Containing Compounds Other (See Comments)    Pt had reaction when given this with pregnancy    Level of Care/Admitting Diagnosis ED Disposition    ED Disposition Condition Comment   Admit  Hospital Area: [facility] [[id number]]  Level of Care: Med-Surg [16]  Covid Evaluation: Asymptomatic Screening Protocol (No Symptoms)  Diagnosis: Altered mental status [780.97.ICD-9-CM]  Admitting Physician: Hannah BeatMANSY, JAN A [[id number]]  Attending Physician: Hannah BeatMANSY, JAN A [[id number]]  PT Class (Do Not Modify): Observation [104]  PT Acc Code (Do Not Modify): Observation [10022]       B Medical/Surgery History Past Medical History:  Diagnosis Date  . Amenorrhea   . Anxiety   . Hypertension   . Migraines    Past Surgical History:  Procedure Laterality Date  . CERVICAL CERCLAGE N/A 09/12/2016   Procedure: CERCLAGE CERVICAL;  Surgeon: Hildred LaserAnika Cherry, MD;  Location: [facility] ORS;  Service: Gynecology;  Laterality: N/A;  . MENISCUS REPAIR Right      A IV Location/Drains/Wounds Patient Lines/Drains/Airways Status   Active Line/Drains/Airways    Name:   Placement date:   Placement time:   Site:   Days:   Peripheral IV 02/20/19 Right Hand   02/20/19    2007    Hand   1   Incision (Closed)  09/12/16 Vagina Other (Comment)   09/12/16    1540     892          Intake/Output Last 24 hours No intake or output data in the 24 hours ending 02/21/19 0056  Labs/Imaging Results for orders placed or performed during the hospital encounter of 02/20/19 (from the past 48 hour(s))  Comprehensive metabolic panel     Status: Abnormal   Collection Time: 02/20/19  8:00 PM  Result Value Ref Range   Sodium 140 135 - 145 mmol/L   Potassium 3.7 3.5 - 5.1 mmol/L   Chloride 110 98 - 111 mmol/L   CO2 19 (L) 22 - 32 mmol/L   Glucose, Bld 135 (H) 70 - 99 mg/dL   BUN 8 6 - 20 mg/dL   Creatinine, Ser 7.250.61 0.44 - 1.00 mg/dL   Calcium 8.8 (L) 8.9 - 10.3 mg/dL   Total Protein 7.4 6.5 - 8.1 g/dL   Albumin 4.5 3.5 - 5.0 g/dL   AST 56 (H) 15 - 41 U/L   ALT 55 (H) 0 - 44 U/L   Alkaline Phosphatase 48 38 - 126 U/L   Total Bilirubin 0.7 0.3 - 1.2 mg/dL   GFR calc non Af Amer >60 >60 mL/min   GFR calc Af Amer >60 >60 mL/min   Anion gap 11 5 - 15    Comment: Performed at [facility], [address] [place], [place] [address]  CBC  with Differential     Status: None   Collection Time: 02/20/19  8:00 PM  Result Value Ref Range   WBC 6.6 4.0 - 10.5 K/uL   RBC 4.76 3.87 - 5.11 MIL/uL   Hemoglobin 15.0 12.0 - 15.0 g/dL   HCT 16.144.9 09.636.0 - 04.546.0 %   MCV 94.3 80.0 - 100.0 fL   MCH 31.5 26.0 - 34.0 pg   MCHC 33.4 30.0 - 36.0 g/dL   RDW 40.914.1 81.111.5 - 91.415.5 %   Platelets 202 150 - 400 K/uL   nRBC 0.0 0.0 - 0.2 %   Neutrophils Relative % 60 %   Neutro Abs 4.0 1.7 - 7.7 K/uL   Lymphocytes Relative 31 %   Lymphs Abs 2.0 0.7 - 4.0 K/uL   Monocytes Relative 6 %   Monocytes Absolute 0.4 0.1 - 1.0 K/uL   Eosinophils Relative 1 %   Eosinophils Absolute 0.1 0.0 - 0.5 K/uL   Basophils Relative 1 %   Basophils Absolute 0.0 0.0 - 0.1 K/uL   Immature Granulocytes 1 %   Abs Immature Granulocytes 0.03 0.00 - 0.07 K/uL    Comment: Performed at [facility], [address] [place], [place]  [address]  Ethanol     Status: Abnormal   Collection Time: 02/20/19  8:00 PM  Result Value Ref Range   Alcohol, Ethyl (B) 26 (H) <10 mg/dL    Comment: (NOTE) Lowest detectable limit for serum alcohol is 10 mg/dL. For medical purposes only. Performed at [facility], [address] [place], [place] [address]   Acetaminophen level     Status: Abnormal   Collection Time: 02/20/19  8:00 PM  Result Value Ref Range   Acetaminophen (Tylenol), Serum <10 (L) 10 - 30 ug/mL    Comment: (NOTE) Therapeutic concentrations vary significantly. A range of 10-30 ug/mL  may be an effective concentration for many patients. However, some  are best treated at concentrations outside of this range. Acetaminophen concentrations >150 ug/mL at 4 hours after ingestion  and >50 ug/mL at 12 hours after ingestion are often associated with  toxic reactions. Performed at [facility], [address] [place], [place] [address]   Salicylate level     Status: None   Collection Time: 02/20/19  8:00 PM  Result Value Ref Range   Salicylate Lvl <7.0 2.8 - 30.0 mg/dL    Comment: Performed at [facility], [address] [place], [place] [address]  Protime-INR     Status: None   Collection Time: 02/20/19  8:00 PM  Result Value Ref Range   Prothrombin Time 15.0 11.4 - 15.2 seconds   INR 1.2 0.8 - 1.2    Comment: (NOTE) INR goal varies based on device and disease states. Performed at [facility], [address] [place], [place] [address]    Ct Head Wo Contrast  Result Date: 02/20/2019 CLINICAL DATA:  43 year old female with altered mental status. Blunt head trauma a couple of days ago. EXAM: CT HEAD WITHOUT CONTRAST CT CERVICAL SPINE WITHOUT CONTRAST TECHNIQUE: Multidetector CT imaging of the head and cervical spine was performed following the standard protocol without intravenous contrast. Multiplanar CT image reconstructions of the cervical spine were also generated.  COMPARISON:  Cervical spine MRI 02/19/2018. FINDINGS: CT HEAD FINDINGS Brain: No midline shift, ventriculomegaly, mass effect, evidence of mass lesion, intracranial hemorrhage or evidence of cortically based acute infarction. Gray-white matter differentiation is within normal limits throughout the brain. Vascular: No suspicious intracranial  vascular hyperdensity. Skull: Intact. 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 Other: Asymmetric right face soft tissue swelling and mild stranding overlying the right zygoma which appears intact. Other orbit and scalp soft tissues appear negative. CT CERVICAL SPINE FINDINGS Alignment: Cervicothoracic junction alignment is within normal limits. Reversed cervical lordosis. Bilateral posterior element alignment is within normal limits. Skull base and vertebrae: Visualized skull base is intact. No atlanto-occipital dissociation. Intermittent motion artifact. Cervical vertebrae appear intact. No acute osseous abnormality identified. Soft tissues and spinal canal: No prevertebral fluid or swelling. No visible canal hematoma. Partially retropharyngeal course of the carotids, normal variant. A mild motion artifact. Negative neck soft tissues. Disc levels: Lower cervical disc and endplate degeneration. Up to mild associated lower cervical spinal stenosis. Upper chest: Visible upper thoracic levels appear intact. Negative lung apices. IMPRESSION: 1. Right face soft tissue injury without underlying zygoma fracture. 2. Normal noncontrast CT appearance of the brain. 3. Mild cervical spine motion artifact, no acute traumatic injury identified in the cervical spine. Electronically Signed   By: Odessa FlemingH  Hall M.D.   On: 02/20/2019 22:40   Ct Cervical Spine Wo Contrast  Result Date: 02/20/2019 CLINICAL DATA:  43 year old female with altered mental status. Blunt head trauma a couple of days ago. EXAM: CT HEAD WITHOUT  CONTRAST CT CERVICAL SPINE WITHOUT CONTRAST TECHNIQUE: Multidetector CT imaging of the head and cervical spine was performed following the standard protocol without intravenous contrast. Multiplanar CT image reconstructions of the cervical spine were also generated. COMPARISON:  Cervical spine MRI 02/19/2018. FINDINGS: CT HEAD FINDINGS Brain: No midline shift, ventriculomegaly, mass effect, evidence of mass lesion, intracranial hemorrhage or evidence of cortically based acute infarction. Gray-white matter differentiation is within normal limits throughout the brain. Vascular: No suspicious intracranial vascular hyperdensity. Skull: Intact. 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 Other: Asymmetric right face soft tissue swelling and mild stranding overlying the right zygoma which appears intact. Other orbit and scalp soft tissues appear negative. CT CERVICAL SPINE FINDINGS Alignment: Cervicothoracic junction alignment is within normal limits. Reversed cervical lordosis. Bilateral posterior element alignment is within normal limits. Skull base and vertebrae: Visualized skull base is intact. No atlanto-occipital dissociation. Intermittent motion artifact. Cervical vertebrae appear intact. No acute osseous abnormality identified. Soft tissues and spinal canal: No prevertebral fluid or swelling. No visible canal hematoma. Partially retropharyngeal course of the carotids, normal variant. A mild motion artifact. Negative neck soft tissues. Disc levels: Lower cervical disc and endplate degeneration. Up to mild associated lower cervical spinal stenosis. Upper chest: Visible upper thoracic levels appear intact. Negative lung apices. IMPRESSION: 1. Right face soft tissue injury without underlying zygoma fracture. 2. Normal noncontrast CT appearance of the brain. 3. Mild cervical spine motion artifact, no acute traumatic injury identified in the  cervical spine. Electronically Signed   By: Odessa FlemingH  Hall M.D.   On: 02/20/2019 22:40    Pending Labs Unresulted Labs (From admission, onward)    Start     Ordered   02/21/19 0500  Comprehensive metabolic panel  Tomorrow morning,   STAT     02/20/19 2350   02/20/19 2345  HIV antibody (Routine Testing)  Once,   STAT     02/20/19 2350   02/20/19 2314  SARS Coronavirus 2 (CEPHEID - Performed in [facility]), Hosp Order  (Asymptomatic Patients Labs)  ONCE - STAT,   STAT    Question:  Rule Out  Answer:  Yes   02/20/19 2314   02/20/19 2304  Urine Drug Screen, Qualitative  Add-on,   AD     02/20/19 2303          Vitals/Pain Today's Vitals   02/20/19 1955 02/20/19 1956 02/20/19 2110 02/20/19 2151  BP: (!) 164/112  (!) 151/110 (!) 167/104  Pulse: (!) 110  (!) 127 (!) 115  Resp:   (!) 27 (!) 24  Temp: 98.7 F (37.1 C)   97.8 F (36.6 C)  TempSrc: Rectal   Axillary  SpO2: 95%  98% 98%  Weight:  81.6 kg    Height:  5\' 6"  (1.676 m)    PainSc:    Asleep    Isolation Precautions No active isolations  Medications Medications  thiamine (B-1) injection 100 mg (has no administration in time range)  albuterol (VENTOLIN HFA) 108 (90 Base) MCG/ACT inhaler 1-2 puff (has no administration in time range)  sodium chloride flush (NS) 0.9 % injection 3 mL (has no administration in time range)  enoxaparin (LOVENOX) injection 40 mg (has no administration in time range)  sodium chloride 0.9 % 1,000 mL with thiamine 595 mg, folic acid 1 mg, multivitamins adult 10 mL infusion (has no administration in time range)  sodium chloride flush (NS) 0.9 % injection 3 mL (has no administration in time range)  0.9 %  sodium chloride infusion (has no administration in time range)  0.9 %  sodium chloride infusion (has no administration in time range)  acetaminophen (TYLENOL) tablet 650 mg (has no administration in time range)    Or  acetaminophen (TYLENOL) suppository 650 mg (has no administration in  time range)  traZODone (DESYREL) tablet 25 mg (has no administration in time range)  ondansetron (ZOFRAN) tablet 4 mg (has no administration in time range)    Or  ondansetron (ZOFRAN) injection 4 mg (has no administration in time range)  LORazepam (ATIVAN) injection 2 mg (2 mg Intravenous Given 02/20/19 2007)  LORazepam (ATIVAN) injection 2 mg (2 mg Intravenous Given 02/20/19 2112)  haloperidol lactate (HALDOL) injection 5 mg (5 mg Intravenous Given 02/20/19 2118)  haloperidol lactate (HALDOL) injection 5 mg (5 mg Intravenous Given 02/20/19 2211)    Mobility walks     Focused Assessments Neuro Assessment Handoff:  Swallow screen pass? Yes      Last date known well: 02/20/19 Last time known well: 1455 Neuro Assessment: Exceptions to WDL Neuro Checks:      Last Documented NIHSS Modified Score:       R Recommendations: See Admitting Provider Note  Report given to:   Additional Notes: pt had sitter previously before meds

## 2019-02-21 NOTE — Discharge Instructions (Signed)
Resume diet and activity as before  QUIT ALCOHOL

## 2019-02-22 LAB — NOVEL CORONAVIRUS, NAA (HOSP ORDER, SEND-OUT TO REF LAB; TAT 18-24 HRS): SARS-CoV-2, NAA: NOT DETECTED

## 2019-02-22 LAB — HIV ANTIBODY (ROUTINE TESTING W REFLEX): HIV Screen 4th Generation wRfx: NONREACTIVE

## 2019-03-04 NOTE — Discharge Summary (Signed)
[place] [facility] [place] NAME: Joyce Gonzalez    MR#:  [id number]  DATE OF BIRTH:  Jun 13, 1976  DATE OF ADMISSION:  02/20/2019 ADMITTING PHYSICIAN: Christel Mormon, MD  DATE OF DISCHARGE: 02/21/2019  4:46 PM  PRIMARY CARE PHYSICIAN: [place], [facility]   ADMISSION DIAGNOSIS:  Altered mental status, unspecified altered mental status type [R41.82]  DISCHARGE DIAGNOSIS:  Active Problems:   Altered mental status   SECONDARY DIAGNOSIS:   Past Medical History:  Diagnosis Date  . Amenorrhea   . Anxiety   . Hypertension   . Migraines      ADMITTING HISTORY  HISTORY OF PRESENT ILLNESS:  Joyce Gonzalez  is a 43 y.o. fairly somnolent Caucasian female with a known history of alcohol abuse, hypertension, and anxiety and migraine, who presented to the emergency room with acute onset of altered mental status.  The patient hit her head against a cabinet door last night and possibly had a syncope.  Today around 3 PM she was noted by her husband to be walking without clothes and she was significantly agitated and combative in the ER.  No reported paresthesias or focal muscle weakness.  She usually drinks bad 100 ounces of alcoholic drinks per day and today she had 8 ounces.  The patient was significantly somnolent and snoring during my interview and difficult to arouse being responsive only to painful stimuli and therefore no history could be obtained from her.  Upon presentation to the emergency room, blood pressure was elevated 164/112 with a pulse of 110 respiratory rate of 18, temperature of 98.7 pulse extremity 95% on room air.  Labs revealed a CO2 of 19 with a blood glucose of 135 anion gap of 11, slightly elevated AST of 56 and ALT of 55 compared to 35/25 on 11/19/2017.  Her PT was 15 and INR 1.2 and urine pregnancy test came back negative.  However alcohol levels 26 with Tylenol level less than 10 and salicylate less than 7.  Noncontrasted head CT scan  revealed right face soft tissue injury without underlying zygomatic fracture no acute intracranial abnormalities and C-spine CT showed no acute findings but given mild cervical spine motion artifact an MRI of the C-spine was done and it revealed.  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  There was shallow left paracentral protrusion at C6-7 narrowing the ventral thecal sac with open right foramen and central canal and mild left foraminal narrowing.  The patient was given 2 mg of IV Ativan twice as well as 5 mg of IV Haldol twice and 100 mg IV thiamine emergency room given significant agitation, restlessness and combativeness.  She will be admitted to an observation medical monitored bed for further evaluation and management.   HOSPITAL COURSE:   *Acute encephalopathy *Syncope *Hypertensive urgency *Anxiety  Patient was admitted to medical floor from ED.  Her symptoms were attributed to alcohol intoxication.  She improved well.  Initially was drowsy after receiving some Ativan and Haldol.  By time of discharge she is alert and oriented.  Able to make medical decisions.  Ambulated with physical therapy.  Discussed with family and discharged home in stable condition.  Counseled to quit alcohol.  CONSULTS OBTAINED:    DRUG ALLERGIES:   Allergies  Allergen Reactions  . Magnesium-Containing Compounds Other (See Comments)    Pt had reaction when  given this with pregnancy    DISCHARGE MEDICATIONS:   Allergies as of 02/21/2019      Reactions   Magnesium-containing Compounds Other (See Comments)   Pt had reaction when given this with pregnancy      Medication List    TAKE these medications   albuterol 108 (90 Base) MCG/ACT inhaler Commonly known as: VENTOLIN HFA Inhale 2 puffs into the lungs every 4 (four) hours as needed for wheezing.       Today    VITAL SIGNS:  Blood pressure (!) 154/130, pulse (!) 108, temperature 98.1 F (36.7 C), resp. rate 20, height 5\' 6"  (1.676 m), weight 81.6 kg, SpO2 100 %.  I/O:  No intake or output data in the 24 hours ending 03/04/19 1533  PHYSICAL EXAMINATION:  Physical Exam  GENERAL:  43 y.o.-year-old patient lying in the bed with no acute distress.  LUNGS: Normal breath sounds bilaterally, no wheezing, rales,rhonchi or crepitation. No use of accessory muscles of respiration.  CARDIOVASCULAR: S1, S2 normal. No murmurs, rubs, or gallops.  ABDOMEN: Soft, non-tender, non-distended. Bowel sounds present. No organomegaly or mass.  NEUROLOGIC: Moves all 4 extremities. PSYCHIATRIC: The patient is alert and oriented x 3.  SKIN: No obvious rash, lesion, or ulcer.   DATA REVIEW:   CBC No results for input(s): WBC, HGB, HCT, PLT in the last 168 hours.  Chemistries  No results for input(s): NA, K, CL, CO2, GLUCOSE, BUN, CREATININE, CALCIUM, MG, AST, ALT, ALKPHOS, BILITOT in the last 168 hours.  Invalid input(s): GFRCGP  Cardiac Enzymes No results for input(s): TROPONINI in the last 168 hours.  Microbiology Results  Results for orders placed or performed during the hospital encounter of 02/20/19  Novel Coronavirus,NAA,(SEND-OUT TO REF LAB - TAT 24-48 hrs); Hosp Order     Status: None   Collection Time: 02/21/19  1:09 AM   Specimen: Nasopharyngeal Swab; Respiratory  Result Value Ref Range Status   SARS-CoV-2, NAA NOT DETECTED NOT DETECTED Final    Comment: (NOTE) This test was developed and its performance characteristics determined by [facility]. This test has not been FDA cleared or approved. This test has been authorized by FDA under an Emergency Use Authorization (EUA). This test is only authorized for the duration of time the declaration that circumstances exist justifying the authorization of the emergency use of in vitro diagnostic tests for detection of SARS-CoV-2 virus and/or  diagnosis of COVID-19 infection under section 564(b)(1) of the Act, 21 U.S.C. 409WJX-9(J)(4360bbb-3(b)(1), unless the authorization is terminated or revoked sooner. When diagnostic testing is negative, the possibility of a false negative result should be considered in the context of a patient's recent exposures and the presence of clinical signs and symptoms consistent with COVID-19. An individual without symptoms of COVID-19 and who is not shedding SARS-CoV-2 virus would expect to have a negative (not detected) result in this assay. Performed  At: [facility]  [address] [place], [place] [id number] Jolene SchimkeNagendra Sanjai MD YQ:[id number]:[phone]    Coronavirus Source NASOPHARYNGEAL  Final    Comment: Performed at [facility], [address] [place], [place] [address]    RADIOLOGY:  No results found.  Follow up with PCP in 1 week.  Management plans discussed with the patient, family and they are in agreement.  CODE STATUS:  Code Status History    Date Active Date Inactive Code Status Order ID Comments User Context   02/20/2019 2350 02/21/2019 1946 Full Code [id number]  Mansy, Vernetta HoneyJan A, MD ED  09/30/2016 1027 09/30/2016 2132 Full Code [id number]  Linzie CollinEvans, David James, MD Inpatient   Advance Care Planning Activity      TOTAL TIME TAKING CARE OF THIS PATIENT ON DAY OF DISCHARGE: more than 30 minutes.   Molinda BailiffSrikar R Rynell Ciotti M.D on 03/04/2019 at 3:33 PM  Between 7am to 6pm - Pager - [phone]  After 6pm go to [email or web address] - password [email or web address]  [facility]  Office  [phone]  CC: Primary care physician; Jerrilyn [place], [facility]  Note: This dictation was prepared with Dragon dictation along with smaller phrase technology. Any transcriptional errors that result from this process are unintentional.

## 2019-11-20 ENCOUNTER — Other Ambulatory Visit: Payer: Self-pay

## 2019-11-20 DIAGNOSIS — N6321 Unspecified lump in the left breast, upper outer quadrant: Secondary | ICD-10-CM

## 2019-12-13 ENCOUNTER — Other Ambulatory Visit: Payer: Self-pay | Admitting: Student

## 2019-12-13 DIAGNOSIS — R0609 Other forms of dyspnea: Secondary | ICD-10-CM

## 2019-12-13 DIAGNOSIS — R002 Palpitations: Secondary | ICD-10-CM

## 2019-12-20 ENCOUNTER — Encounter: Payer: Self-pay | Admitting: Radiology

## 2019-12-23 ENCOUNTER — Ambulatory Visit: Admission: RE | Admit: 2019-12-23 | Payer: Medicaid Other | Source: Ambulatory Visit

## 2019-12-23 ENCOUNTER — Ambulatory Visit: Payer: Medicaid Other

## 2019-12-23 ENCOUNTER — Other Ambulatory Visit: Payer: Medicaid Other

## 2019-12-24 ENCOUNTER — Ambulatory Visit: Admission: RE | Admit: 2019-12-24 | Payer: Medicaid Other | Source: Ambulatory Visit

## 2020-01-07 ENCOUNTER — Ambulatory Visit
Admission: RE | Admit: 2020-01-07 | Discharge: 2020-01-07 | Disposition: A | Discharge status: Home / Self Care | Payer: Medicaid Other | Source: Ambulatory Visit | Attending: Student | Admitting: Student

## 2020-01-07 ENCOUNTER — Other Ambulatory Visit: Payer: Self-pay

## 2020-01-07 DIAGNOSIS — R0600 Dyspnea, unspecified: Secondary | ICD-10-CM | POA: Diagnosis present

## 2020-01-07 DIAGNOSIS — R002 Palpitations: Secondary | ICD-10-CM

## 2020-01-07 DIAGNOSIS — R0609 Other forms of dyspnea: Secondary | ICD-10-CM

## 2020-01-07 LAB — NM MYOCAR MULTI W/SPECT W/WALL MOTION / EF
Estimated workload: 7 METS
Exercise duration (min): 1 min
Exercise duration (sec): 1 s
LV dias vol: 69 mL (ref 46–106)
LV sys vol: 26 mL
Peak HR: 97 {beats}/min
Percent HR: 54 %
Rest HR: 65 {beats}/min
SDS: 0
SRS: 0
SSS: 2
TID: 1.04

## 2020-01-07 MED ORDER — TECHNETIUM TC 99M TETROFOSMIN IV KIT
10.6900 | PACK | Freq: Once | INTRAVENOUS | Status: AC | PRN
  Administered 2020-01-07: 10.69 via INTRAVENOUS

## 2020-01-07 MED ORDER — REGADENOSON 0.4 MG/5ML IV SOLN
0.4000 mg | Freq: Once | INTRAVENOUS | Status: AC
  Administered 2020-01-07: 0.4 mg via INTRAVENOUS

## 2020-01-07 MED ORDER — TECHNETIUM TC 99M TETROFOSMIN IV KIT
30.0000 | PACK | Freq: Once | INTRAVENOUS | Status: AC | PRN
  Administered 2020-01-07: 32.264 via INTRAVENOUS

## 2020-04-27 ENCOUNTER — Other Ambulatory Visit: Payer: Self-pay | Admitting: Physician Assistant

## 2020-04-27 DIAGNOSIS — M5416 Radiculopathy, lumbar region: Secondary | ICD-10-CM

## 2020-05-18 ENCOUNTER — Ambulatory Visit
Admission: RE | Admit: 2020-05-18 | Discharge: 2020-05-18 | Disposition: A | Discharge status: Home / Self Care | Payer: Medicaid Other | Source: Ambulatory Visit | Attending: Physician Assistant | Admitting: Physician Assistant

## 2020-05-18 ENCOUNTER — Other Ambulatory Visit: Payer: Self-pay

## 2020-05-18 DIAGNOSIS — M5416 Radiculopathy, lumbar region: Secondary | ICD-10-CM | POA: Diagnosis present

## 2020-08-31 ENCOUNTER — Other Ambulatory Visit: Payer: Self-pay | Admitting: Gerontology

## 2020-08-31 DIAGNOSIS — Z1231 Encounter for screening mammogram for malignant neoplasm of breast: Secondary | ICD-10-CM

## 2020-10-27 ENCOUNTER — Other Ambulatory Visit: Payer: Medicaid Other

## 2020-10-27 ENCOUNTER — Inpatient Hospital Stay: Admission: RE | Admit: 2020-10-27 | Payer: Medicaid Other | Source: Ambulatory Visit

## 2020-11-04 ENCOUNTER — Other Ambulatory Visit: Payer: Medicaid Other

## 2020-11-04 ENCOUNTER — Inpatient Hospital Stay: Admission: RE | Admit: 2020-11-04 | Payer: Medicaid Other | Source: Ambulatory Visit

## 2020-11-12 ENCOUNTER — Other Ambulatory Visit: Payer: Medicaid Other

## 2020-11-12 ENCOUNTER — Inpatient Hospital Stay: Admission: RE | Admit: 2020-11-12 | Payer: Medicaid Other | Source: Ambulatory Visit

## 2020-12-02 ENCOUNTER — Ambulatory Visit
Admission: RE | Admit: 2020-12-02 | Discharge: 2020-12-02 | Disposition: A | Discharge status: Home / Self Care | Payer: Medicaid Other | Source: Ambulatory Visit

## 2020-12-02 ENCOUNTER — Other Ambulatory Visit: Payer: Self-pay

## 2020-12-02 DIAGNOSIS — N6321 Unspecified lump in the left breast, upper outer quadrant: Secondary | ICD-10-CM | POA: Insufficient documentation

## 2020-12-11 ENCOUNTER — Other Ambulatory Visit: Payer: Self-pay

## 2020-12-11 ENCOUNTER — Ambulatory Visit
Admission: EM | Admit: 2020-12-11 | Discharge: 2020-12-11 | Disposition: A | Discharge status: Home / Self Care | Payer: Medicaid Other | Attending: Emergency Medicine | Admitting: Emergency Medicine

## 2020-12-11 DIAGNOSIS — U071 COVID-19: Secondary | ICD-10-CM | POA: Diagnosis present

## 2020-12-11 LAB — RESP PANEL BY RT-PCR (FLU A&B, COVID) ARPGX2
Influenza A by PCR: NEGATIVE
Influenza B by PCR: NEGATIVE
SARS Coronavirus 2 by RT PCR: POSITIVE — AB

## 2020-12-11 MED ORDER — BENZONATATE 100 MG PO CAPS: 200.0000 mg | ORAL_CAPSULE | Freq: Three times a day (TID) | ORAL | 0 refills | Status: DC

## 2020-12-11 MED ORDER — PROMETHAZINE-DM 6.25-15 MG/5ML PO SYRP: 5.0000 mL | ORAL_SOLUTION | Freq: Four times a day (QID) | ORAL | 0 refills | Status: DC | PRN

## 2020-12-11 NOTE — ED Triage Notes (Signed)
Patient complains of body aches, fever, chills, headache, dizziness x 2 days.

## 2020-12-11 NOTE — Discharge Instructions (Addendum)
You tested positive for COVID today.  You need to quarantine for 5 days for your symptoms started.  After 5 days you can break quarantine if your symptoms have improved and you have not had a fever for 24 hours without taking Tylenol and ibuprofen.  Use over-the-counter Tylenol or ibuprofen as needed for body aches and pain or fever.  Use the Tessalon Perles during the day as needed for cough and the Promethazine DM cough syrup at bedtime as needed for cough and congestion.  If you develop shortness of breath, especially at rest, you are unable to catch her breath, and you are unable to speak in full sentences, or is a late sign your lips are turning blue you need go to the ER for evaluation.

## 2020-12-11 NOTE — ED Provider Notes (Signed)
[facility]    CSN: [id number] Arrival date & time: 12/11/20  1114      History   Chief Complaint Chief Complaint  Patient presents with  . Generalized Body Aches    HPI Joyce Gonzalez is a 45 y.o. female.   HPI   45 year old female here for evaluation of body aches, fever, chills, headache, and dizziness.  Patient reports that her symptoms have been going on for last 2 days and she has had a T-max of 101.  Patient states that she has had some chest tightness and nonproductive cough.  She denies ear pain.  Runny nose, nasal congestion, nausea, vomiting, or diarrhea.  Patient states she has had some abdominal cramping.  Patient has taken 3 at home COVID test that have all been positive and she has not vaccinated as COVID.  Past Medical History:  Diagnosis Date  . Amenorrhea   . Anxiety   . Hypertension   . Migraines     Patient Active Problem List   Diagnosis Date Noted  . Altered mental status 02/20/2019  . Contracted pelvis during pregnancy 09/30/2016  . Preterm delivery 09/30/2016  . History of preterm delivery, currently pregnant in second trimester 09/22/2016  . Cervical incompetence affecting management of pregnancy in second trimester, antepartum 09/22/2016  . Essential hypertension 07/28/2016  . Iron deficiency anemia 07/28/2016  . Osteoarthritis 07/28/2016  . Asthma, well controlled, mild intermittent 07/28/2016  . Seasonal allergies 07/28/2016  . History of alcohol abuse 07/28/2016  . Hypertension     Past Surgical History:  Procedure Laterality Date  . CERVICAL CERCLAGE N/A 09/12/2016   Procedure: CERCLAGE CERVICAL;  Surgeon: Hildred Laser, MD;  Location: [facility] ORS;  Service: Gynecology;  Laterality: N/A;  . MENISCUS REPAIR Right     OB History    Gravida  4   Para  3   Term  0   Preterm  2   AB  1   Living  2     SAB  1   IAB  0   Ectopic  0   Multiple  0   Live Births  2        Obstetric Comments  04/2004 pt  called this a stillbirth.         Home Medications    Prior to Admission medications   Medication Sig Start Date End Date Taking? Authorizing Provider  albuterol (VENTOLIN HFA) 108 (90 Base) MCG/ACT inhaler Inhale 2 puffs into the lungs every 4 (four) hours as needed for wheezing.   Yes [provider]  benzonatate (TESSALON) 100 MG capsule Take 2 capsules (200 mg total) by mouth every 8 (eight) hours. 12/11/20  Yes Becky Augusta, NP  promethazine-dextromethorphan (PROMETHAZINE-DM) 6.25-15 MG/5ML syrup Take 5 mLs by mouth 4 (four) times daily as needed. 12/11/20  Yes Becky Augusta, NP  SUBOXONE 8-2 MG FILM Place under the tongue 2 (two) times daily. 12/10/20  Yes [provider]    Family History Family History  Problem Relation Age of Onset  . Osteoarthritis Mother   . Asthma Mother   . Diabetes Mother   . Hyperlipidemia Mother   . Migraines Mother   . Diabetes Father   . Osteoarthritis Maternal Aunt   . Rheum arthritis Maternal Uncle   . Heart failure Maternal Uncle   . Hypertension Maternal Uncle   . Diabetes Paternal Uncle   . Asthma Maternal Grandmother   . Heart failure Maternal Grandmother   . Diabetes Paternal  Grandmother   . Rashes / Skin problems Daughter        eczema  . Alzheimer's disease Other        family history    Social History Social History   Tobacco Use  . Smoking status: Former Smoker    Packs/day: 0.50    Types: Cigarettes  . Smokeless tobacco: Never Used  Vaping Use  . Vaping Use: Never used  Substance Use Topics  . Alcohol use: No    Comment: not now, drank heavy before she found out  . Drug use: No     Allergies   Magnesium-containing compounds   Review of Systems Review of Systems  Constitutional: Positive for chills and fever. Negative for activity change and appetite change.  HENT: Negative for congestion, ear pain, rhinorrhea and sore throat.   Respiratory: Positive for chest tightness. Negative for shortness of  breath and wheezing.   Gastrointestinal: Positive for abdominal pain. Negative for diarrhea and vomiting.  Musculoskeletal: Positive for arthralgias and myalgias.  Skin: Negative for rash.  Neurological: Positive for dizziness and headaches.  Hematological: Negative.   Psychiatric/Behavioral: Negative.      Physical Exam Triage Vital Signs ED Triage Vitals  Enc Vitals Group     BP 12/11/20 1152 120/83     Pulse Rate 12/11/20 1152 82     Resp 12/11/20 1152 18     Temp 12/11/20 1152 98.9 F (37.2 C)     Temp Source 12/11/20 1152 Oral     SpO2 12/11/20 1152 99 %     Weight 12/11/20 1151 154 lb (69.9 kg)     Height 12/11/20 1151 5\' 4"  (1.626 m)     Head Circumference --      Peak Flow --      Pain Score 12/11/20 1151 7     Pain Loc --      Pain Edu? --      Excl. in GC? --    No data found.  Updated Vital Signs BP 120/83 (BP Location: Right Arm)   Pulse 82   Temp 98.9 F (37.2 C) (Oral)   Resp 18   Ht 5\' 4"  (1.626 m)   Wt 154 lb (69.9 kg)   SpO2 99%   BMI 26.43 kg/m   Visual Acuity Right Eye Distance:   Left Eye Distance:   Bilateral Distance:    Right Eye Near:   Left Eye Near:    Bilateral Near:     Physical Exam Vitals and nursing note reviewed.  Constitutional:      General: She is not in acute distress.    Appearance: Normal appearance.  HENT:     Head: Normocephalic and atraumatic.     Right Ear: Tympanic membrane, ear canal and external ear normal. There is no impacted cerumen.     Left Ear: Tympanic membrane, ear canal and external ear normal. There is no impacted cerumen.     Nose: Congestion and rhinorrhea present.     Mouth/Throat:     Pharynx: Oropharynx is clear. No posterior oropharyngeal erythema.  Cardiovascular:     Rate and Rhythm: Normal rate and regular rhythm.     Pulses: Normal pulses.     Heart sounds: Normal heart sounds. No murmur heard. No gallop.   Pulmonary:     Effort: Pulmonary effort is normal.     Breath sounds:  Normal breath sounds. No wheezing, rhonchi or rales.  Musculoskeletal:     Cervical back: Normal range  of motion and neck supple.  Skin:    General: Skin is warm and dry.     Capillary Refill: Capillary refill takes less than 2 seconds.     Findings: No erythema or rash.  Neurological:     General: No focal deficit present.     Mental Status: She is alert and oriented to person, place, and time.  Psychiatric:        Mood and Affect: Mood normal.        Behavior: Behavior normal.        Thought Content: Thought content normal.        Judgment: Judgment normal.      UC Treatments / Results  Labs (all labs ordered are listed, but only abnormal results are displayed) Labs Reviewed  RESP PANEL BY RT-PCR (FLU A&B, COVID) ARPGX2 - Abnormal; Notable for the following components:      Result Value   SARS Coronavirus 2 by RT PCR POSITIVE (*)    All other components within normal limits    EKG   Radiology No results found.  Procedures Procedures (including critical care time)  Medications Ordered in UC Medications - No data to display  Initial Impression / Assessment and Plan / UC Course  I have reviewed the triage vital signs and the nursing notes.  Pertinent labs & imaging results that were available during my care of the patient were reviewed by me and considered in my medical decision making (see chart for details).   Patient is a very pleasant, nontoxic-appearing 7 old female here for evaluation of COVID-like symptoms that been going on for the past 2 days.  She has taken 3 at home COVID test that have all been positive.  Her physical exam reveals pearly gray tympanic membranes bilaterally with normal light reflex and clear external auditory canals.  Nasal mucosa is erythematous and edematous with scant clear nasal discharge.  Oropharyngeal exam is unremarkable.  No cervical adenopathy patient exam.  Cardiopulmonary exam is benign.  Respiratory triplex panel collected at  triage.  Respiratory triplex panel is positive for COVID but negative for influenza.  We will discharge patient home to quarantine with Tylenol and ibuprofen as needed for fever and body aches, Tessalon Perles and Promethazine DM as needed for cough, ear precautions reviewed with patient.  Work note provided.   Final Clinical Impressions(s) / UC Diagnoses   Final diagnoses:  COVID-19     Discharge Instructions     You tested positive for COVID today.  You need to quarantine for 5 days for your symptoms started.  After 5 days you can break quarantine if your symptoms have improved and you have not had a fever for 24 hours without taking Tylenol and ibuprofen.  Use over-the-counter Tylenol or ibuprofen as needed for body aches and pain or fever.  Use the Tessalon Perles during the day as needed for cough and the Promethazine DM cough syrup at bedtime as needed for cough and congestion.  If you develop shortness of breath, especially at rest, you are unable to catch her breath, and you are unable to speak in full sentences, or is a late sign your lips are turning blue you need go to the ER for evaluation.    ED Prescriptions    Medication Sig Dispense Auth. Provider   benzonatate (TESSALON) 100 MG capsule Take 2 capsules (200 mg total) by mouth every 8 (eight) hours. 21 capsule Becky Augusta, NP   promethazine-dextromethorphan (PROMETHAZINE-DM) 6.25-15 MG/5ML syrup  Take 5 mLs by mouth 4 (four) times daily as needed. 118 mL Becky Augustayan, Serenity Fortner, NP     PDMP not reviewed this encounter.   Becky Augustayan, Demitrios Molyneux, NP 12/11/20 1308

## 2020-12-12 ENCOUNTER — Telehealth: Payer: Self-pay | Admitting: Unknown Physician Specialty

## 2020-12-12 NOTE — Telephone Encounter (Signed)
Called to discuss with patient about COVID-19 symptoms and the use of one of the available treatments for those with mild to moderate Covid symptoms and at a high risk of hospitalization.  Pt appears to qualify for outpatient treatment due to co-morbid conditions and/or a member of an at-risk group in accordance with the FDA Emergency Use Authorization.    Symptom onset: 5/4 Vaccinated: no Booster? no Qualifiers: overweight NIH Criteria: 2  Unable to reach pt - LMOM  Joyce Gonzalez

## 2021-03-04 ENCOUNTER — Other Ambulatory Visit: Payer: Self-pay

## 2021-03-04 ENCOUNTER — Ambulatory Visit
Admission: EM | Admit: 2021-03-04 | Discharge: 2021-03-04 | Disposition: A | Discharge status: Home / Self Care | Payer: Medicaid Other | Attending: Internal Medicine | Admitting: Internal Medicine

## 2021-03-04 DIAGNOSIS — K0889 Other specified disorders of teeth and supporting structures: Secondary | ICD-10-CM | POA: Diagnosis not present

## 2021-03-04 MED ORDER — PENICILLIN V POTASSIUM 500 MG PO TABS: 500.0000 mg | ORAL_TABLET | Freq: Two times a day (BID) | ORAL | 0 refills | Status: AC

## 2021-03-04 NOTE — ED Triage Notes (Signed)
Patient complains of left upper dental pain. States that she has an appt to see her dentist coming up. States that she needs a doctors note for missing work today due to pain.

## 2021-03-04 NOTE — ED Provider Notes (Signed)
[facility]    CSN: [id number] Arrival date & time: 03/04/21  1526      History   Chief Complaint Chief Complaint  Patient presents with   Dental Pain    HPI Joyce Gonzalez is a 45 y.o. female presents with L upper dental pain worse today than this week and left work early and needs a note. Has dentist apt in 2 days. Taking ibuprofen and Tylenol for pain which help.     Past Medical History:  Diagnosis Date   Amenorrhea    Anxiety    Hypertension    Migraines     Patient Active Problem List   Diagnosis Date Noted   Altered mental status 02/20/2019   Contracted pelvis during pregnancy 09/30/2016   Preterm delivery 09/30/2016   History of preterm delivery, currently pregnant in second trimester 09/22/2016   Cervical incompetence affecting management of pregnancy in second trimester, antepartum 09/22/2016   Essential hypertension 07/28/2016   Iron deficiency anemia 07/28/2016   Osteoarthritis 07/28/2016   Asthma, well controlled, mild intermittent 07/28/2016   Seasonal allergies 07/28/2016   History of alcohol abuse 07/28/2016   Hypertension     Past Surgical History:  Procedure Laterality Date   CERVICAL CERCLAGE N/A 09/12/2016   Procedure: CERCLAGE CERVICAL;  Surgeon: Hildred Laser, MD;  Location: [facility] ORS;  Service: Gynecology;  Laterality: N/A;   MENISCUS REPAIR Right     OB History     Gravida  4   Para  3   Term  0   Preterm  2   AB  1   Living  2      SAB  1   IAB  0   Ectopic  0   Multiple  0   Live Births  2        Obstetric Comments  04/2004 pt called this a stillbirth.          Home Medications    Prior to Admission medications   Medication Sig Start Date End Date Taking? Authorizing Provider  albuterol (VENTOLIN HFA) 108 (90 Base) MCG/ACT inhaler Inhale 2 puffs into the lungs every 4 (four) hours as needed for wheezing.   Yes [provider]  penicillin v potassium (VEETID) 500 MG tablet Take 1  tablet (500 mg total) by mouth in the morning and at bedtime for 7 days. 03/04/21 03/11/21 Yes Rodriguez-Southworth, Nettie Elm, PA-C  SUBOXONE 8-2 MG FILM Place under the tongue 2 (two) times daily. 12/10/20  Yes [provider]    Family History Family History  Problem Relation Age of Onset   Osteoarthritis Mother    Asthma Mother    Diabetes Mother    Hyperlipidemia Mother    Migraines Mother    Diabetes Father    Osteoarthritis Maternal Aunt    Rheum arthritis Maternal Uncle    Heart failure Maternal Uncle    Hypertension Maternal Uncle    Diabetes Paternal Uncle    Asthma Maternal Grandmother    Heart failure Maternal Grandmother    Diabetes Paternal Grandmother    Rashes / Skin problems Daughter        eczema   Alzheimer's disease Other        family history    Social History Social History   Tobacco Use   Smoking status: Former    Packs/day: 0.50    Types: Cigarettes   Smokeless tobacco: Never  Vaping Use   Vaping Use: Never used  Substance Use Topics  Alcohol use: No    Comment: not now, drank heavy before she found out   Drug use: No     Allergies   Magnesium-containing compounds   Review of Systems Review of Systems  Constitutional:  Negative for fever.  HENT:  Positive for dental problem.   Neurological:  Negative for headaches.  Hematological:  Negative for adenopathy.    Physical Exam Triage Vital Signs ED Triage Vitals  Enc Vitals Group     BP 03/04/21 1621 133/90     Pulse Rate 03/04/21 1621 76     Resp 03/04/21 1621 18     Temp 03/04/21 1621 98.3 F (36.8 C)     Temp Source 03/04/21 1621 Oral     SpO2 03/04/21 1621 99 %     Weight 03/04/21 1620 145 lb (65.8 kg)     Height 03/04/21 1620 5\' 4"  (1.626 m)     Head Circumference --      Peak Flow --      Pain Score 03/04/21 1620 4     Pain Loc --      Pain Edu? --      Excl. in GC? --    No data found.  Updated Vital Signs BP 133/90 (BP Location: Left Arm)   Pulse 76   Temp  98.3 F (36.8 C) (Oral)   Resp 18   Ht 5\' 4"  (1.626 m)   Wt 145 lb (65.8 kg)   SpO2 99%   BMI 24.89 kg/m   Visual Acuity Right Eye Distance:   Left Eye Distance:   Bilateral Distance:    Right Eye Near:   Left Eye Near:    Bilateral Near:     Physical Exam Constitutional:      General: She is not in acute distress.    Appearance: She is normal weight. She is not toxic-appearing.  HENT:     Right Ear: External ear normal.     Left Ear: External ear normal.     Mouth/Throat:     Comments: Has L upper 1st molar tenderness with tapping. The one next to it has been extracted.  Eyes:     General: No scleral icterus.    Conjunctiva/sclera: Conjunctivae normal.  Pulmonary:     Effort: Pulmonary effort is normal.  Musculoskeletal:     Cervical back: Neck supple.  Lymphadenopathy:     Cervical: No cervical adenopathy.  Skin:    General: Skin is warm and dry.     Findings: No rash.  Neurological:     Mental Status: She is alert and oriented to person, place, and time.     Gait: Gait normal.  Psychiatric:        Mood and Affect: Mood normal.        Behavior: Behavior normal.        Thought Content: Thought content normal.        Judgment: Judgment normal.     UC Treatments / Results  Labs (all labs ordered are listed, but only abnormal results are displayed) Labs Reviewed - No data to display  EKG   Radiology No results found.  Procedures Procedures (including critical care time)  Medications Ordered in UC Medications - No data to display  Initial Impression / Assessment and Plan / UC Course  I have reviewed the triage vital signs and the nursing notes. Dental pain, with possible early infection. I placed her on [facility] as noted and will keep apt with dentist as  scheduled.      Final Clinical Impressions(s) / UC Diagnoses   Final diagnoses:  Pain, dental   Discharge Instructions   None    ED Prescriptions     Medication Sig Dispense Auth. Provider    penicillin v potassium (VEETID) 500 MG tablet Take 1 tablet (500 mg total) by mouth in the morning and at bedtime for 7 days. 20 tablet Rodriguez-Southworth, Nettie Elm, PA-C      PDMP not reviewed this encounter.   Garey Ham, [place] 03/04/21 1753

## 2022-03-01 NOTE — Congregational Nurse Program (Signed)
  Dept: [phone]   Congregational Nurse Program Note  Date of Encounter: 03/01/2022 Client new to clinic. She came in this morning reporting nausea. Stated that she and her husband are trying to stop drinking and had not had a drink in 3 days. Discussed dangers of stopping all at once. She stated that they had done it before.  BP (!) 150/98 (BP Location: Left Arm, Patient Position: Sitting, Cuff Size: Normal)   Pulse (!) 117   SpO2 98% .She has been able to donate plasma with out difficulty. Encouraged her to return to the clinic for continued monitoring of her BP, she voiced understanding.  Past Medical History: Past Medical History:  Diagnosis Date   Amenorrhea    Anxiety    Hypertension    Migraines     Encounter Details:  CNP Questionnaire - 03/01/22 1108       Questionnaire   Do you give verbal consent to treat you today? Yes    Location Patient Served  [facility]    Visit Setting Church or Organization    Patient Status Homeless    Insurance [facility]    Insurance Referral N/A    Medication N/A   reports she has taken BP medication in the past   Medical Provider No   unsure at this tiem   Screening Referrals N/A    Medical Referral N/A   will discuss at next visit   Medical Appointment Made N/A    Food Have Food Insecurities    Transportation N/A   has own transportation   Housing/Utilities No permanent housing   currently living in a camper with her husband, they have no permanent place for it   Interpersonal Safety N/A    Intervention Blood pressure;Support    ED Visit Averted N/A    Life-Saving Intervention Made N/A

## 2022-03-13 ENCOUNTER — Other Ambulatory Visit: Payer: Self-pay

## 2022-03-13 ENCOUNTER — Ambulatory Visit
Admission: EM | Admit: 2022-03-13 | Discharge: 2022-03-13 | Disposition: A | Discharge status: Home / Self Care | Payer: Medicaid Other | Attending: Emergency Medicine | Admitting: Emergency Medicine

## 2022-03-13 ENCOUNTER — Encounter: Payer: Self-pay | Admitting: Emergency Medicine

## 2022-03-13 DIAGNOSIS — M25561 Pain in right knee: Secondary | ICD-10-CM

## 2022-03-13 MED ORDER — CYCLOBENZAPRINE HCL 10 MG PO TABS: 10.0000 mg | ORAL_TABLET | Freq: Every day | ORAL | 0 refills | Status: AC

## 2022-03-13 MED ORDER — KETOROLAC TROMETHAMINE 60 MG/2ML IM SOLN
30.0000 mg | Freq: Once | INTRAMUSCULAR | Status: AC
  Administered 2022-03-13: 30 mg via INTRAMUSCULAR

## 2022-03-13 NOTE — Discharge Instructions (Signed)
Your pain is most likely caused by irritation to the tendon or ligaments.   You have been given an injection of Toradol here today in office to help reduce the inflammatory process that occurs with injury  Once home you may take ibuprofen 600 to 800 mg 3 times daily (every 8 hours) for 5 days to continue the process above  Use muscle relaxer at bedtime as needed for additional support, be mindful this medication may make you drowsy  You may use heating pad in 15 minute intervals as needed for additional comfort, within the first 2-3 days you may find comfort in using ice in 10-15 minutes over affected area  Begin stretching affected area daily for 10 minutes as tolerated to further loosen muscles   When lying down place pillow underneath and between knees for support  If pain persist after recommended treatment or reoccurs if may be beneficial to follow up with orthopedic specialist for evaluation, this doctor specializes in the bones and can manage your symptoms long-term with options such as but not limited to imaging, medications or physical therapy

## 2022-03-13 NOTE — ED Triage Notes (Signed)
Pt c/o right knee pain. Started last night. She states she cleaned the house yesterday. Pt states she has torn her meniscus in this same knee in the past.

## 2022-03-13 NOTE — ED Provider Notes (Signed)
[facility]    CSN: [id number] Arrival date & time: 03/13/22  1103      History   Chief Complaint Chief Complaint  Patient presents with   Knee Pain    right    HPI Joyce Gonzalez is a 46 y.o. female.   Patient presents with right knee pain and swelling beginning 1 day ago after cleaning her home.  Pain began when she straighten her leg and has been constant since interfering with sleep.  Initially began as medial pain but is now generalized to the anterior.  Painful to bear weight . endorses that she feels like there is bruising present as well.  Has attempted use of IcyHot and Tylenol which were ineffective.  History of meniscus tear requiring surgical intervention.     Past Medical History:  Diagnosis Date   Amenorrhea    Anxiety    Hypertension    Migraines     Patient Active Problem List   Diagnosis Date Noted   Altered mental status 02/20/2019   Contracted pelvis during pregnancy 09/30/2016   Preterm delivery 09/30/2016   History of preterm delivery, currently pregnant in second trimester 09/22/2016   Cervical incompetence affecting management of pregnancy in second trimester, antepartum 09/22/2016   Essential hypertension 07/28/2016   Iron deficiency anemia 07/28/2016   Osteoarthritis 07/28/2016   Asthma, well controlled, mild intermittent 07/28/2016   Seasonal allergies 07/28/2016   History of alcohol abuse 07/28/2016   Hypertension     Past Surgical History:  Procedure Laterality Date   CERVICAL CERCLAGE N/A 09/12/2016   Procedure: CERCLAGE CERVICAL;  Surgeon: Hildred Laser, MD;  Location: [facility] ORS;  Service: Gynecology;  Laterality: N/A;   MENISCUS REPAIR Right     OB History     Gravida  4   Para  3   Term  0   Preterm  2   AB  1   Living  2      SAB  1   IAB  0   Ectopic  0   Multiple  0   Live Births  2        Obstetric Comments  04/2004 pt called this a stillbirth.          Home Medications    Prior to  Admission medications   Medication Sig Start Date End Date Taking? Authorizing Provider  albuterol (VENTOLIN HFA) 108 (90 Base) MCG/ACT inhaler Inhale 2 puffs into the lungs every 4 (four) hours as needed for wheezing.    [provider]  SUBOXONE 8-2 MG FILM Place under the tongue 2 (two) times daily. 12/10/20   [provider]    Family History Family History  Problem Relation Age of Onset   Osteoarthritis Mother    Asthma Mother    Diabetes Mother    Hyperlipidemia Mother    Migraines Mother    Diabetes Father    Osteoarthritis Maternal Aunt    Rheum arthritis Maternal Uncle    Heart failure Maternal Uncle    Hypertension Maternal Uncle    Diabetes Paternal Uncle    Asthma Maternal Grandmother    Heart failure Maternal Grandmother    Diabetes Paternal Grandmother    Rashes / Skin problems Daughter        eczema   Alzheimer's disease Other        family history    Social History Social History   Tobacco Use   Smoking status: Every Day    Packs/day:  0.50    Types: Cigarettes   Smokeless tobacco: Never  Vaping Use   Vaping Use: Never used  Substance Use Topics   Alcohol use: No    Comment: not now, drank heavy before she found out   Drug use: No     Allergies   Magnesium-containing compounds   Review of Systems Review of Systems  Constitutional: Negative.   Respiratory: Negative.    Cardiovascular: Negative.   Musculoskeletal:  Positive for myalgias. Negative for arthralgias, back pain, gait problem, joint swelling, neck pain and neck stiffness.  Skin: Negative.      Physical Exam Triage Vital Signs ED Triage Vitals  Enc Vitals Group     BP 03/13/22 1152 120/82     Pulse Rate 03/13/22 1152 92     Resp 03/13/22 1152 16     Temp 03/13/22 1152 98 F (36.7 C)     Temp Source 03/13/22 1152 Oral     SpO2 03/13/22 1152 97 %     Weight 03/13/22 1150 145 lb 1 oz (65.8 kg)     Height 03/13/22 1150 5\' 4"  (1.626 m)     Head Circumference --       Peak Flow --      Pain Score 03/13/22 1149 6     Pain Loc --      Pain Edu? --      Excl. in [place]? --    No data found.  Updated Vital Signs BP 120/82 (BP Location: Left Arm)   Pulse 92   Temp 98 F (36.7 C) (Oral)   Resp 16   Ht 5\' 4"  (1.626 m)   Wt 145 lb 1 oz (65.8 kg)   SpO2 97%   BMI 24.90 kg/m   Visual Acuity Right Eye Distance:   Left Eye Distance:   Bilateral Distance:    Right Eye Near:   Left Eye Near:    Bilateral Near:     Physical Exam Constitutional:      Appearance: Normal appearance.  HENT:     Head: Normocephalic.  Eyes:     Extraocular Movements: Extraocular movements intact.  Pulmonary:     Effort: Pulmonary effort is normal.  Musculoskeletal:     Comments: Mild swelling and tenderness to the anterior of the right knee without ecchymosis, deformity or point tenderness, minimal effort given to bear weight due to the severity of pain, able to extend and flex leg, 2+ popliteal pulse, no ligament laxity noted  Neurological:     Mental Status: She is alert and oriented to person, place, and time.  Psychiatric:        Mood and Affect: Mood normal.        Behavior: Behavior normal.      UC Treatments / Results  Labs (all labs ordered are listed, but only abnormal results are displayed) Labs Reviewed - No data to display  EKG   Radiology No results found.  Procedures Procedures (including critical care time)  Medications Ordered in UC Medications - No data to display  Initial Impression / Assessment and Plan / UC Course  I have reviewed the triage vital signs and the nursing notes.  Pertinent labs & imaging results that were available during my care of the patient were reviewed by me and considered in my medical decision making (see chart for details).  Acute pain of right knee  Vital signs are stable and patient is in no signs of any distress, will defer x-ray imaging  as there was no direct injury to the bone, Toradol injection  given in office and recommended over-the-counter ibuprofen with consistent use for the next 5 days, prescribed Flexeril for additional comfort at bedtime, recommended RICE and knee sleeve applied in office, given precautions that if symptoms have no signs of improvement in the next week she is to follow-up with orthopedic for further evaluation Final Clinical Impressions(s) / UC Diagnoses   Final diagnoses:  None   Discharge Instructions   None    ED Prescriptions   None    PDMP not reviewed this encounter.   Valinda Hoar, NP 03/13/22 1257

## 2022-06-13 ENCOUNTER — Telehealth: Payer: Self-pay

## 2022-06-13 NOTE — Telephone Encounter (Signed)
Calling pt regarding CD report received from [facility]. Specimen Date= 06/02/22 Source= Plasma Test = STS  Preliminary result, date 06/06/22= Positive  Unable to find pt in [place] EDSS. Last RPR found in Epic dated 09/06/16, Non Reactive. Did not find any RPR results in [place].  Call pt, schedule f/u with [facility] to get additional testing with confirmatory. Ask if she has hx of syphilis.

## 2022-06-13 NOTE — Telephone Encounter (Signed)
Phone call to pt at [phone]. Phone rang many times and then went to busy signal. Unable to leave message. Tried again. Received message on second try that call could not be completed as dialed. Tried again. Not getting through.

## 2022-06-14 NOTE — Telephone Encounter (Signed)
Phone call to pt at [phone]. Received message on second try that call could not be completed as dialed. Tried twice.  Phone call to pt contact listed in chart, [phone]. Received messaged that number has been changed disconnect or no longer in service. Tried twice. Phone call to pt contact listed in chart, [phone].  When first dialed "cannot be completed as dialed." 2nd and 3rd calls: Not accepting calls at this time.

## 2022-06-16 NOTE — Telephone Encounter (Addendum)
  Phone call to pt at [phone], [phone], and [phone].  No answer and unable to leave any messages.

## 2022-06-21 NOTE — Telephone Encounter (Signed)
Phone call to pt at [phone], [phone], and [phone].  First 2 numbers, problems with the numbers, not viable.  Last number, person not accepting call, try again later. Unable to leave any messages.

## 2022-06-21 NOTE — Telephone Encounter (Signed)
Spoke with Shawn Stall, DIS. No previous syphilis hx.

## 2022-06-23 NOTE — Telephone Encounter (Signed)
06/23/22 Certified letter mailed (call [facility] about TR performed by [facility])

## 2022-06-28 IMAGING — MG DIGITAL DIAGNOSTIC BILAT W/ TOMO W/ CAD
6 of 10 series · 6 of 30 positions shown · non-contrast
Comparison: Outside diagnostic mammogram and ultrasound dated
03/06/2014.

CLINICAL DATA: LEFT breast lump.

EXAM:
DIGITAL DIAGNOSTIC BILATERAL MAMMOGRAM WITH TOMOSYNTHESIS AND CAD;
ULTRASOUND LEFT BREAST LIMITED
TECHNIQUE: Bilateral digital diagnostic mammography and breast tomosynthesis
was performed. The images were evaluated with computer-aided
detection.; Targeted ultrasound examination of the left breast was
performed

[R CC synth-2D]
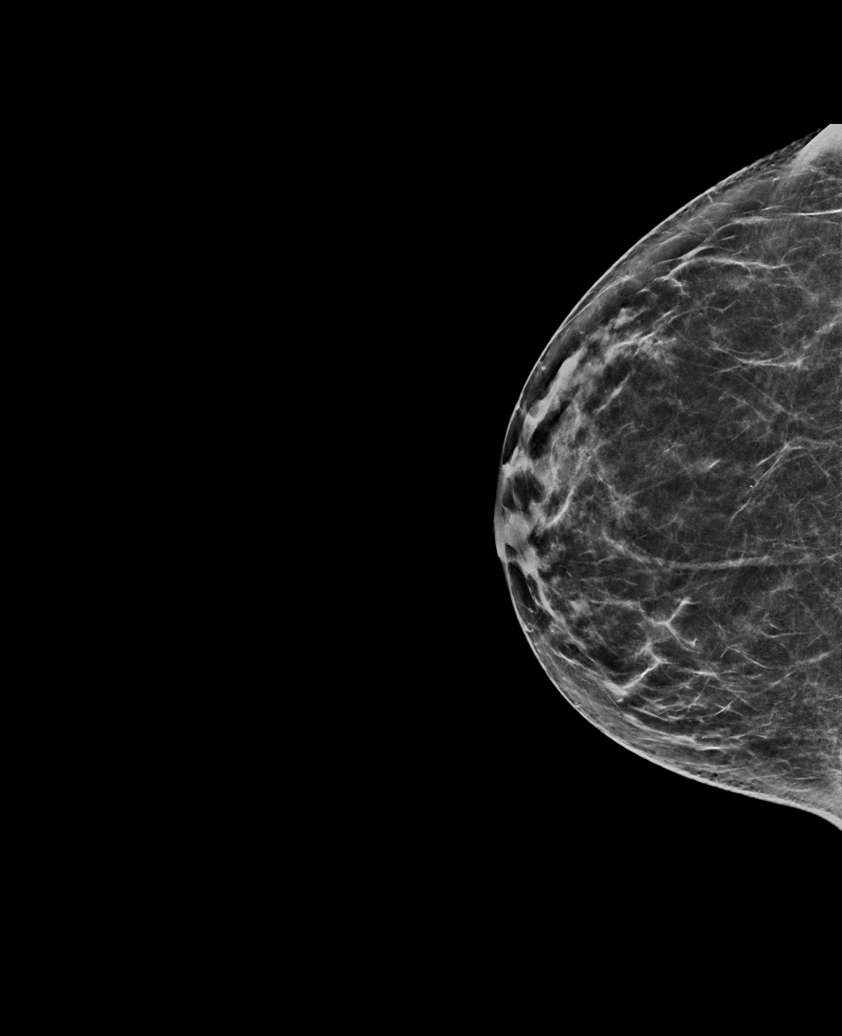

[L CC synth-2D]
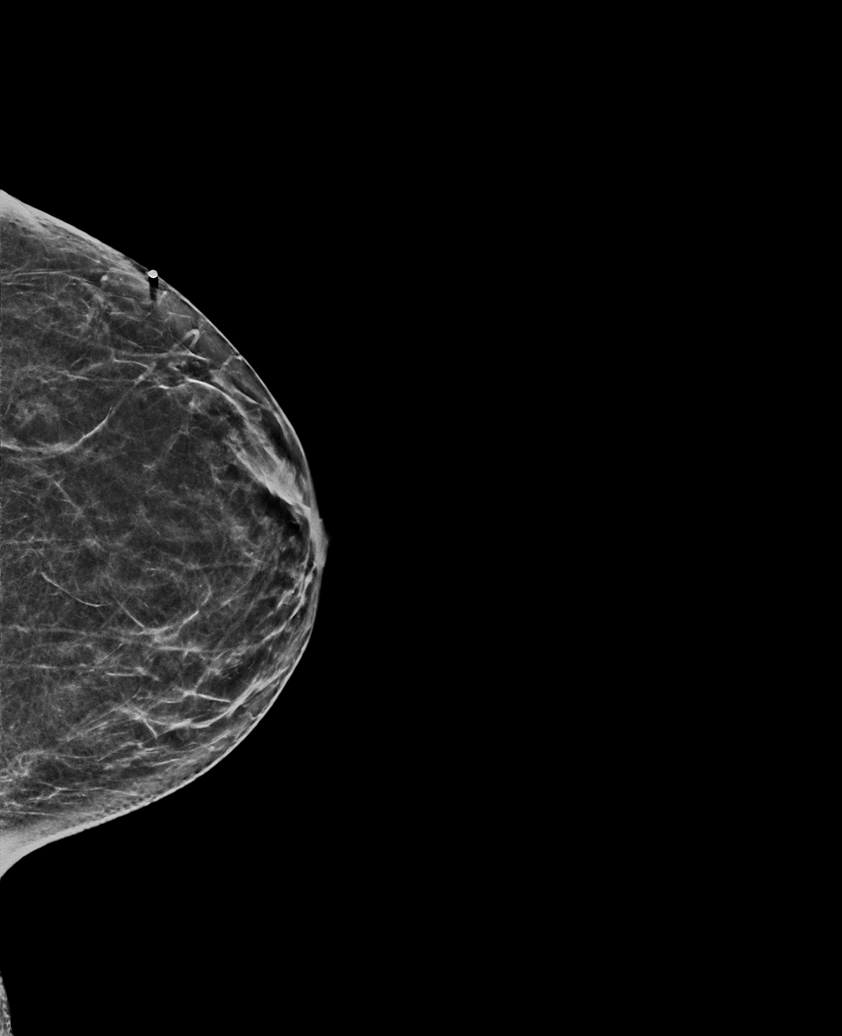

[L MLO synth-2D]
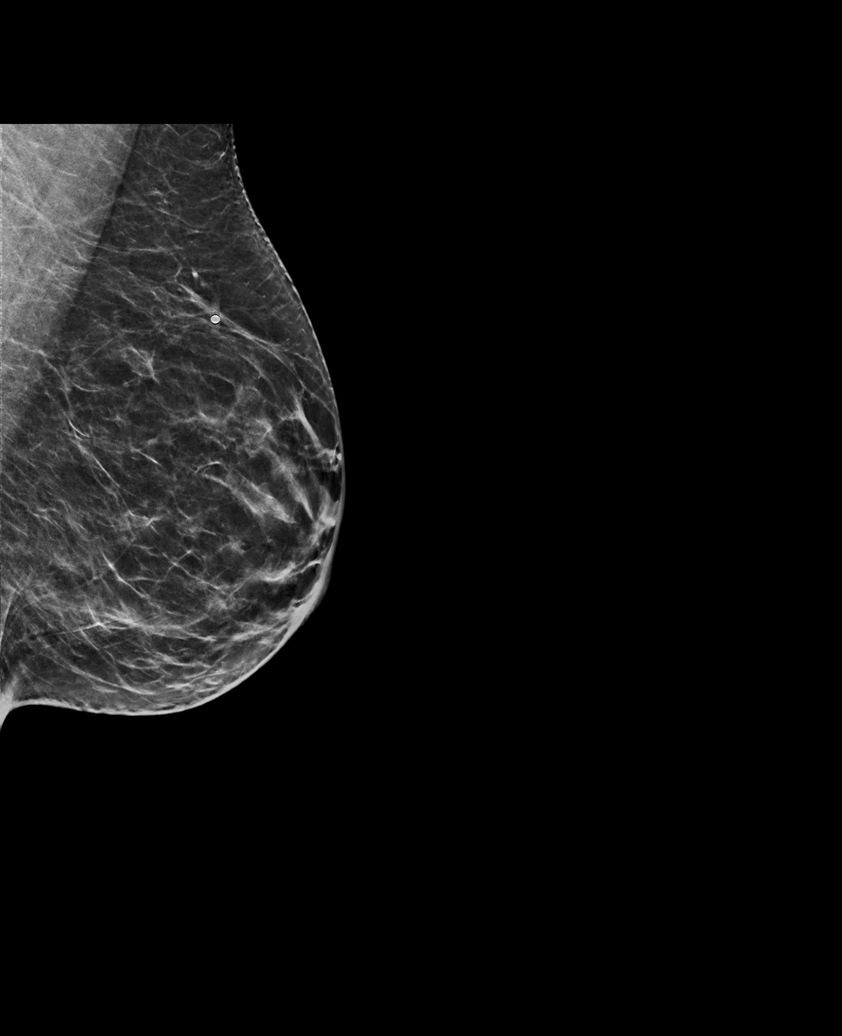

[R MLO synth-2D]
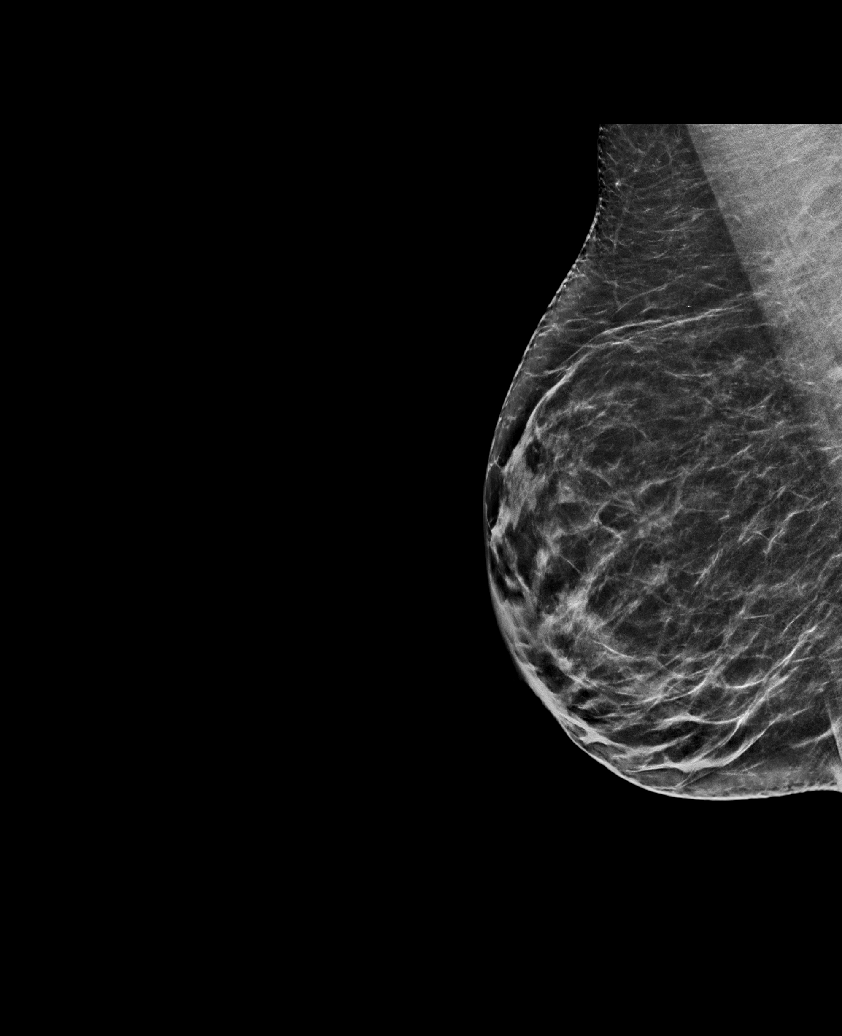

[L TAN synth-2D]
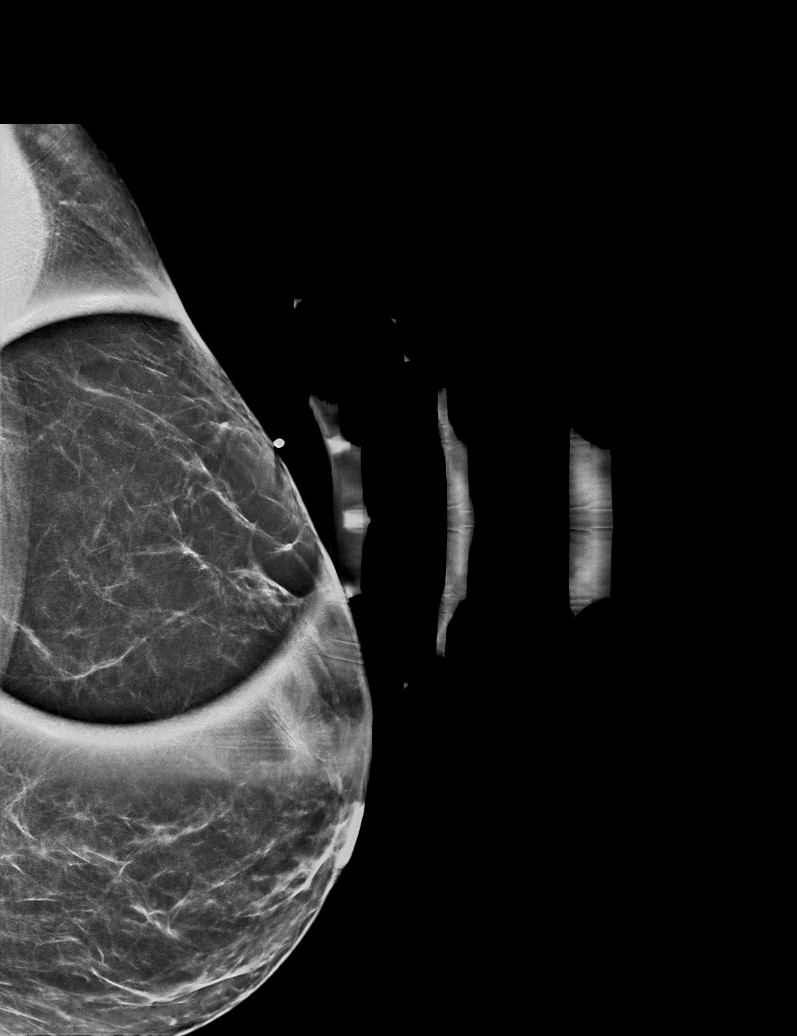

[R CC tomo · tomo slice 33/66.0]
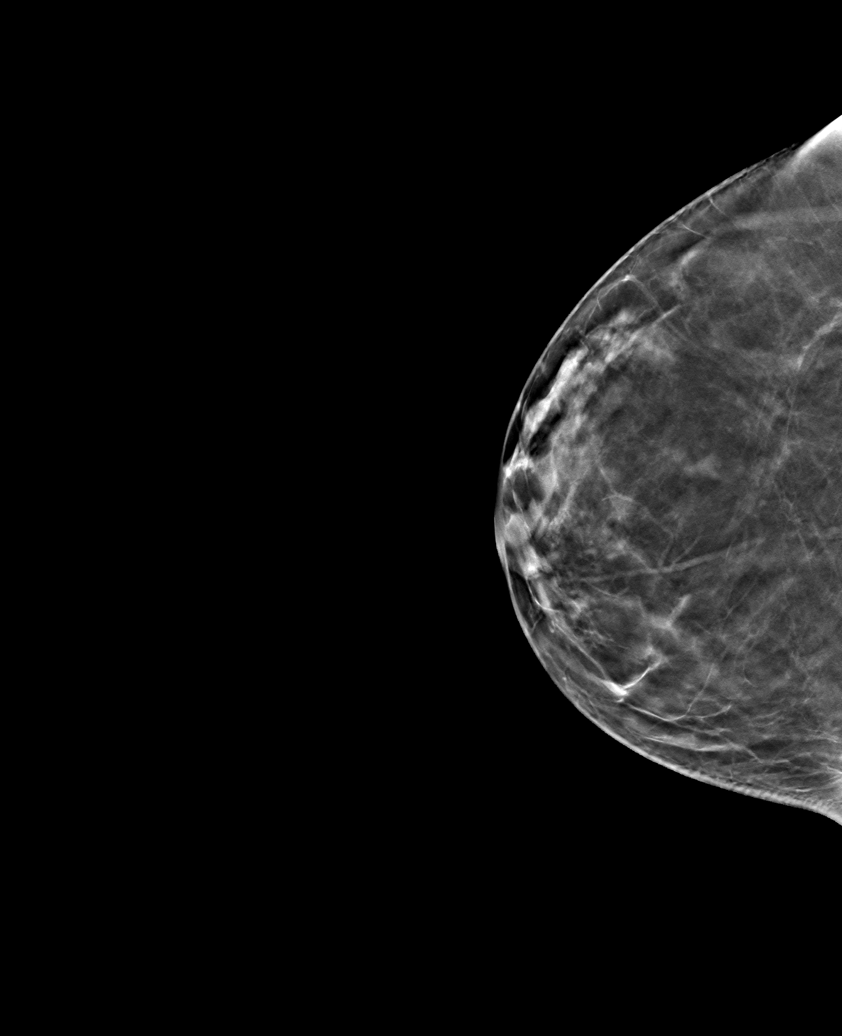

[6 of 30 positions shown; findings below may reference images not displayed]

ACR Breast Density Category b: There are scattered areas of
fibroglandular density.
FINDINGS: Bilateral diagnostic mammogram: There are no new dominant masses,
suspicious calcifications or secondary signs of malignancy within
either breast. There is a lucency within the upper-outer quadrant of
the LEFT breast, at superficial depth, corresponding to the area of
clinical concern, suspected lipoma.

Targeted ultrasound is performed, showing a benign lipoma in the
LEFT breast at the 2 o'clock axis, 6 cm from the nipple, measuring
2.7 x 1 x 2.4 cm, corresponding to the palpable area of concern.
Additional smaller satellite lipoma is seen at the 2 o'clock axis, 6
cm from the nipple, measuring 6 mm greatest dimension. No suspicious
solid or cystic mass is identified by ultrasound.
IMPRESSION: 1. Benign lipomas within the LEFT breast at the 2 o'clock axis, 6 cm
from the nipple, largest measuring 2.7 cm, corresponding to the
palpable area of concern.
2. No evidence of malignancy within either breast.

RECOMMENDATION:
Screening mammogram in one year.(Code:[id number]-[id number])

I have discussed the findings and recommendations with the patient.
If applicable, a reminder letter will be sent to the patient
regarding the next appointment.

BI-RADS CATEGORY  2: Benign.

## 2022-06-28 NOTE — Telephone Encounter (Signed)
Tried calling all pt contact numbers listed in pt's demographics:  Phone call to [phone]. Message received: Changed, disconnected, or no longer in service. Tried twice.  Phone call to [phone]. Message received: Changed, disconnected, or no longer in service. Tried twice.  Phone call to [phone]. Message received: Not accepting calls at this time, try again later. Tried twice.

## 2022-06-29 NOTE — Telephone Encounter (Signed)
Discussed update with DIS, Shawn Stall. Tiera plans to do home visit.

## 2022-07-08 NOTE — Telephone Encounter (Signed)
07/07/22 (late entry):  Shawn Stall, DIS, informed [facility] that she will not be able to make a home visit to the pt this week. Will follow-up next week. Stasia Cavalier, CD Nursing Supervisor, informed of change.

## 2022-07-12 NOTE — Telephone Encounter (Signed)
Spoke with Shawn Stall, DIS, about updates. DIS received new results from [facility], confirmatory TP Non-Reactive. [place] EDSS updated.

## 2022-08-23 ENCOUNTER — Encounter: Payer: Self-pay | Admitting: Emergency Medicine

## 2022-08-23 ENCOUNTER — Other Ambulatory Visit: Payer: Self-pay

## 2022-08-23 ENCOUNTER — Emergency Department
Admission: EM | Admit: 2022-08-23 | Discharge: 2022-08-23 | Disposition: A | Discharge status: Home / Self Care | Payer: Medicaid Other | Attending: Emergency Medicine | Admitting: Emergency Medicine

## 2022-08-23 DIAGNOSIS — F10920 Alcohol use, unspecified with intoxication, uncomplicated: Secondary | ICD-10-CM

## 2022-08-23 DIAGNOSIS — F1010 Alcohol abuse, uncomplicated: Secondary | ICD-10-CM

## 2022-08-23 DIAGNOSIS — Y908 Blood alcohol level of 240 mg/100 ml or more: Secondary | ICD-10-CM | POA: Diagnosis not present

## 2022-08-23 DIAGNOSIS — F10120 Alcohol abuse with intoxication, uncomplicated: Secondary | ICD-10-CM | POA: Insufficient documentation

## 2022-08-23 LAB — COMPREHENSIVE METABOLIC PANEL
ALT: 152 U/L — ABNORMAL HIGH (ref 0–44)
AST: 512 U/L — ABNORMAL HIGH (ref 15–41)
Albumin: 4 g/dL (ref 3.5–5.0)
Alkaline Phosphatase: 76 U/L (ref 38–126)
Anion gap: 11 (ref 5–15)
BUN: 16 mg/dL (ref 6–20)
CO2: 19 mmol/L — ABNORMAL LOW (ref 22–32)
Calcium: 7.8 mg/dL — ABNORMAL LOW (ref 8.9–10.3)
Chloride: 104 mmol/L (ref 98–111)
Creatinine, Ser: 0.67 mg/dL (ref 0.44–1.00)
GFR, Estimated: >60 mL/min (ref >60)
Glucose, Bld: 135 mg/dL — ABNORMAL HIGH (ref 70–99)
Potassium: 3.8 mmol/L (ref 3.5–5.1)
Sodium: 134 mmol/L — ABNORMAL LOW (ref 135–145)
Total Bilirubin: 1.1 mg/dL (ref 0.3–1.2)
Total Protein: 6.6 g/dL (ref 6.5–8.1)

## 2022-08-23 LAB — CBC
HCT: 47.7 % — ABNORMAL HIGH (ref 36.0–46.0)
Hemoglobin: 16.6 g/dL — ABNORMAL HIGH (ref 12.0–15.0)
MCH: 32.9 pg (ref 26.0–34.0)
MCHC: 34.8 g/dL (ref 30.0–36.0)
MCV: 94.5 fL (ref 80.0–100.0)
Platelets: 222 10*3/uL (ref 150–400)
RBC: 5.05 MIL/uL (ref 3.87–5.11)
RDW: 13.5 % (ref 11.5–15.5)
WBC: 7.5 10*3/uL (ref 4.0–10.5)
nRBC: 0 % (ref 0.0–0.2)

## 2022-08-23 LAB — ETHANOL: Alcohol, Ethyl (B): 369 mg/dL (ref <10)

## 2022-08-23 MED ORDER — CHLORDIAZEPOXIDE HCL 25 MG PO CAPS
50.0000 mg | ORAL_CAPSULE | Freq: Once | ORAL | Status: AC
  Administered 2022-08-23: 50 mg via ORAL
  Filled 2022-08-23: qty 2

## 2022-08-23 MED ORDER — CHLORDIAZEPOXIDE HCL 25 MG PO CAPS: ORAL_CAPSULE | ORAL | 0 refills | Status: AC

## 2022-08-23 NOTE — ED Triage Notes (Signed)
Pt in via [facility] wanting ETOH detox. Pt states she's been sober x 3 years, but has recently relapsed in the past few days. Last drink 2hrs ago, 2 small bottles of wine. Pt does report AH/VH, denies SI/HI. 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

## 2022-08-23 NOTE — ED Notes (Signed)
Patient sitting on stretcher. No distress noted. Patient has been ambulating well. Patient tolerated medication and water well. Patient waiting on ride home

## 2022-08-23 NOTE — ED Notes (Signed)
Husband here to take patient home.

## 2022-08-23 NOTE — ED Provider Notes (Signed)
[facility] Provider Note   Event Date/Time   First MD Initiated Contact with Patient 08/23/22 1925     (approximate) History  ETOH detox  HPI Joyce Gonzalez is a 47 y.o. female with a stated past medical history of alcohol abuse after 3 years in remission and relapse 3 days prior to arrival who presents requesting resources for alcohol detoxification.  Patient states that her last drink was approximately 2 hours prior to arrival and was a bottle of wine.  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  Patient states that she is concerned that if she goes "cold [place]" that her blood pressure will be high enough to cause this aneurysm to burst. ROS: Patient currently denies any vision changes, tinnitus, difficulty speaking, facial droop, sore throat, chest pain, shortness of breath, abdominal pain, nausea/vomiting/diarrhea, dysuria, or weakness/numbness/paresthesias in any extremity   Physical Exam  Triage Vital Signs: ED Triage Vitals  Enc Vitals Group     BP 08/23/22 1916 (!) 162/113     Pulse Rate 08/23/22 1916 99     Resp 08/23/22 1916 20     Temp 08/23/22 1916 98.3 F (36.8 C)     Temp Source 08/23/22 1916 Oral     SpO2 08/23/22 1916 97 %     Weight 08/23/22 1917 167 lb (75.8 kg)     Height --      Head Circumference --      Peak Flow --      Pain Score 08/23/22 1917 5     Pain Loc --      Pain Edu? --      Excl. in [place]? --    Most recent vital signs: Vitals:   08/23/22 1916 08/23/22 1921  BP: (!) 162/113 (!) 162/113  Pulse: 99 99  Resp: 20   Temp: 98.3 F (36.8 C)   SpO2: 97%    General: Awake, oriented x4. CV:  Good peripheral perfusion.  Resp:  Normal effort.  Abd:  No distention.  Other:  Middle-aged overweight Caucasian female sitting on stretcher in no acute distress.  Mild  slurred speech ED Results / Procedures / Treatments  Labs (all labs ordered are listed, but only abnormal results are displayed) Labs Reviewed  COMPREHENSIVE METABOLIC PANEL - Abnormal; Notable for the following components:      Result Value   Sodium 134 (*)    CO2 19 (*)    Glucose, Bld 135 (*)    Calcium 7.8 (*)    AST 512 (*)    ALT 152 (*)    All other components within normal limits  ETHANOL - Abnormal; Notable for the following components:   Alcohol, Ethyl (B) 369 (*)    All other components within normal limits  CBC - Abnormal; Notable for the following components:   Hemoglobin 16.6 (*)    HCT 47.7 (*)    All other components within normal limits  URINE DRUG SCREEN, QUALITATIVE ([facility] ONLY)  POC URINE PREG, ED   PROCEDURES: Critical Care performed: No Procedures MEDICATIONS ORDERED IN ED: Medications  chlordiazePOXIDE (LIBRIUM) capsule 50 mg (has no administration in time range)   IMPRESSION / MDM / ASSESSMENT AND PLAN / ED COURSE  I reviewed the triage vital signs and the nursing notes.  Patient's presentation is most consistent with acute presentation with potential threat to life or bodily function. Presents requesting resources for alcohol detoxification +Slurred, sluggish behavior. Stated EtOH intoxication. Airway maintained. Unlikely intracranial bleed, opioid intoxication or coingestion, sepsis, hypothyroidism. Suspect likely transient course of intoxication with expected  improvement of symptoms as patient metabolizes offending agent.  Plan: frequent reassessments  Reassessment Note: Time: 2 hours since initial presentation. Evaluation: Frequent mental status exams showed improving symptoms and evidence that the patients AMS was secondary to intoxication. Pt able to ambulate without difficulty and PO tolerant. Plan DC home with ride and Librium taper Disposition: Discharge home    FINAL CLINICAL IMPRESSION(S) / ED  DIAGNOSES   Final diagnoses:  Alcohol abuse  Alcoholic intoxication without complication ([place])   Rx / DC Orders   ED Discharge Orders          Ordered    chlordiazePOXIDE (LIBRIUM) 25 MG capsule  Multiple Frequencies        08/23/22 2103           Note:  This document was prepared using Dragon voice recognition software and may include unintentional dictation errors.   Naaman Plummer, MD 08/23/22 2107
